# Patient Record
Sex: Female | Born: 1975 | Race: Black or African American | Hispanic: No | Marital: Single | Smoking: Former smoker
Health system: Southern US, Community
[De-identification: ages and names within clinical notes are randomized; demographics above are authoritative.]

## PROBLEM LIST (undated history)

## (undated) DIAGNOSIS — I10 Essential (primary) hypertension: Secondary | ICD-10-CM

## (undated) HISTORY — PX: FEMUR FRACTURE SURGERY: SHX633

## (undated) HISTORY — PX: KIDNEY STONE SURGERY: SHX686

---

## 1998-09-24 ENCOUNTER — Inpatient Hospital Stay (HOSPITAL_COMMUNITY): Admission: EM | Admit: 1998-09-24 | Discharge: 1998-09-28 | Payer: Self-pay | Admitting: Emergency Medicine

## 1998-09-26 ENCOUNTER — Encounter: Payer: Self-pay | Admitting: Internal Medicine

## 1999-01-15 ENCOUNTER — Other Ambulatory Visit: Admission: RE | Admit: 1999-01-15 | Discharge: 1999-01-15 | Payer: Self-pay | Admitting: Family Medicine

## 2002-01-15 ENCOUNTER — Emergency Department (HOSPITAL_COMMUNITY): Admission: EM | Admit: 2002-01-15 | Discharge: 2002-01-15 | Payer: Self-pay | Admitting: Emergency Medicine

## 2002-03-04 ENCOUNTER — Encounter: Admission: RE | Admit: 2002-03-04 | Discharge: 2002-03-04 | Payer: Self-pay | Admitting: Internal Medicine

## 2002-04-04 ENCOUNTER — Encounter: Admission: RE | Admit: 2002-04-04 | Discharge: 2002-04-04 | Payer: Self-pay | Admitting: Urology

## 2002-04-04 ENCOUNTER — Encounter: Payer: Self-pay | Admitting: Urology

## 2002-07-17 ENCOUNTER — Encounter: Payer: Self-pay | Admitting: Emergency Medicine

## 2002-07-17 ENCOUNTER — Emergency Department (HOSPITAL_COMMUNITY): Admission: EM | Admit: 2002-07-17 | Discharge: 2002-07-18 | Payer: Self-pay | Admitting: Emergency Medicine

## 2002-08-31 ENCOUNTER — Emergency Department (HOSPITAL_COMMUNITY): Admission: EM | Admit: 2002-08-31 | Discharge: 2002-08-31 | Payer: Self-pay | Admitting: *Deleted

## 2002-09-26 ENCOUNTER — Emergency Department (HOSPITAL_COMMUNITY): Admission: EM | Admit: 2002-09-26 | Discharge: 2002-09-26 | Payer: Self-pay | Admitting: Emergency Medicine

## 2003-01-04 ENCOUNTER — Emergency Department (HOSPITAL_COMMUNITY): Admission: EM | Admit: 2003-01-04 | Discharge: 2003-01-05 | Payer: Self-pay

## 2003-01-05 ENCOUNTER — Encounter: Payer: Self-pay | Admitting: Emergency Medicine

## 2003-08-01 ENCOUNTER — Ambulatory Visit (HOSPITAL_COMMUNITY): Admission: RE | Admit: 2003-08-01 | Discharge: 2003-08-01 | Payer: Self-pay | Admitting: Obstetrics

## 2003-12-05 ENCOUNTER — Inpatient Hospital Stay (HOSPITAL_COMMUNITY): Admission: RE | Admit: 2003-12-05 | Discharge: 2003-12-08 | Payer: Self-pay | Admitting: Obstetrics

## 2003-12-05 ENCOUNTER — Encounter (INDEPENDENT_AMBULATORY_CARE_PROVIDER_SITE_OTHER): Payer: Self-pay | Admitting: Specialist

## 2004-04-05 IMAGING — US US OB DETAIL+14 WK
1 series · 13 of 28 positions shown · non-contrast
Comparison: none

CLINICAL DATA: G3 P1 TAB1.  LMP the end [DATE].  EDC 12/07/03 by office ultrasound.  Assess fetal anatomy.

[Series 1: unknown · 0.35mm/px · 13 of 90 slices shown]
[im 4/90]
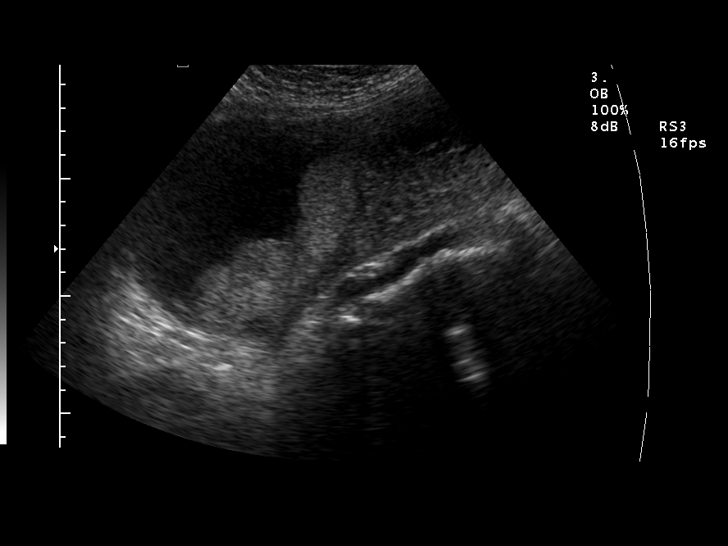
[im 10/90]
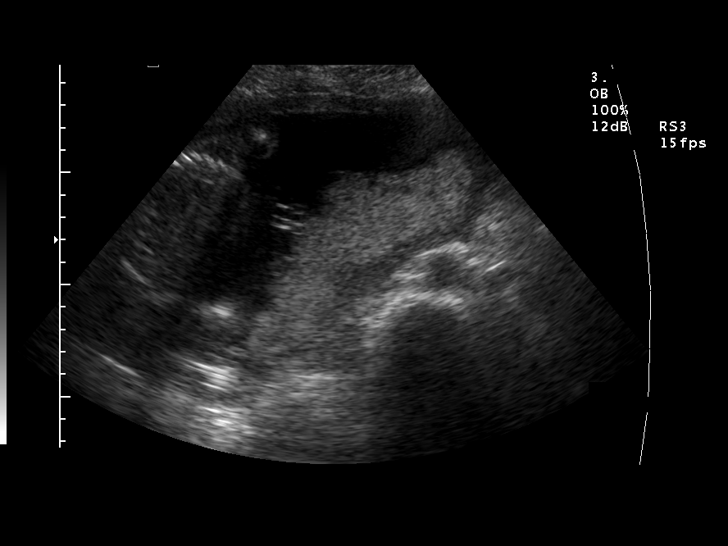
[im 17/90]
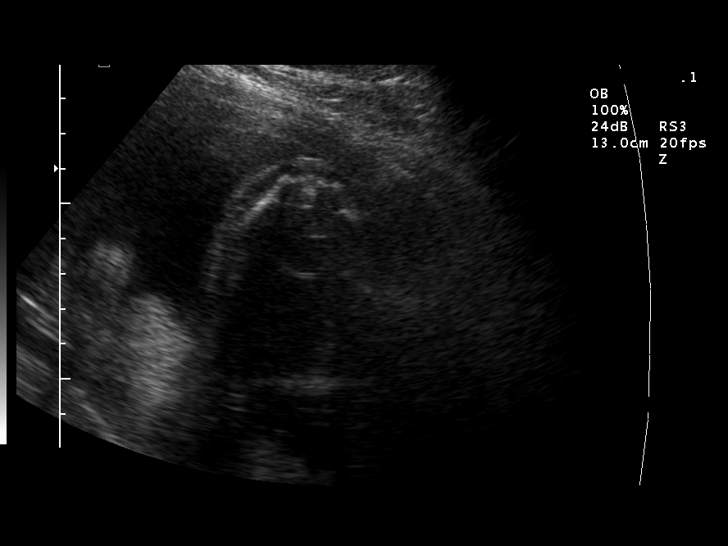
[im 24/90]
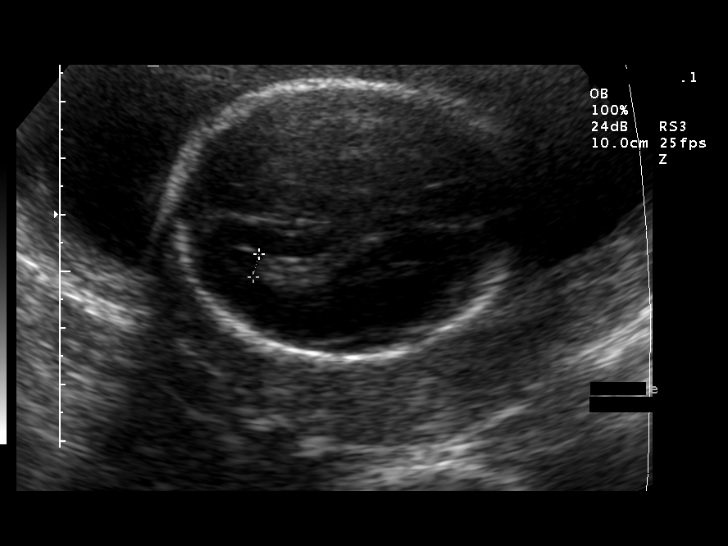
[im 30/90]
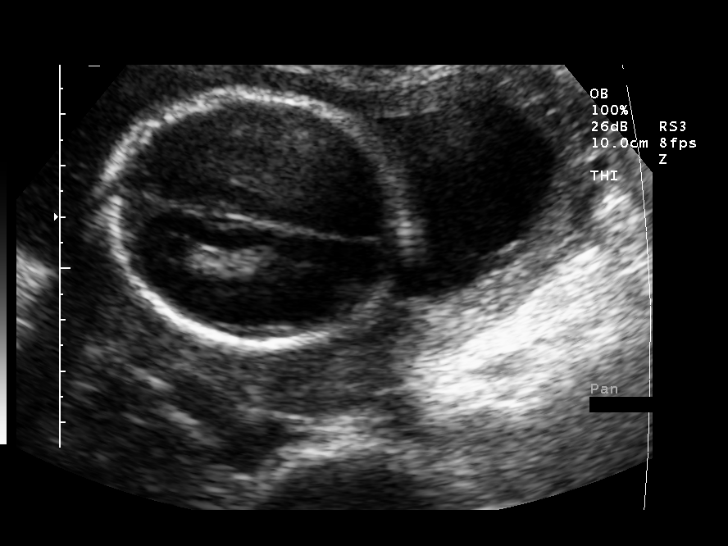
[im 37/90]
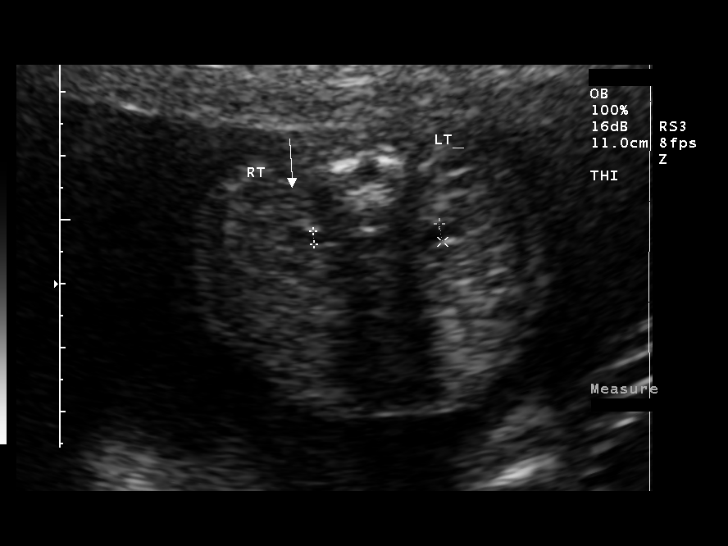
[im 47/90]
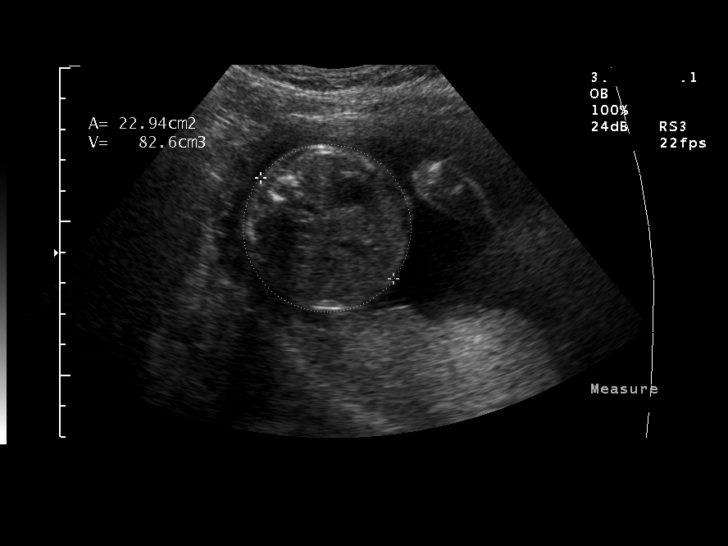
[im 53/90]
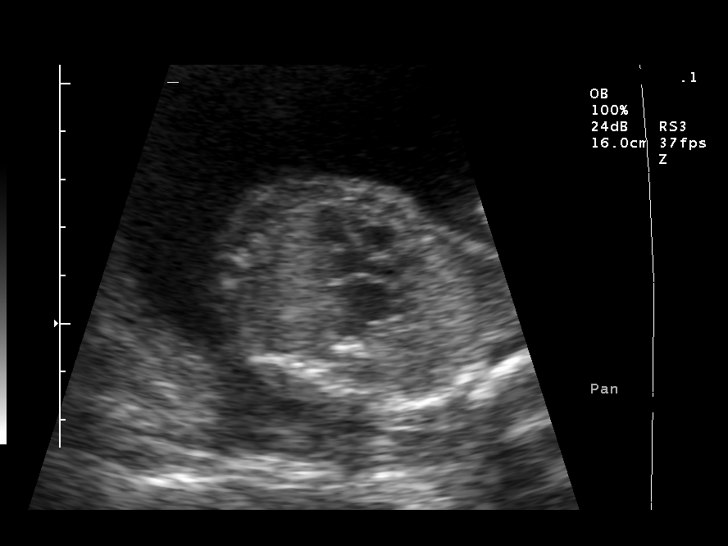
[im 60/90]
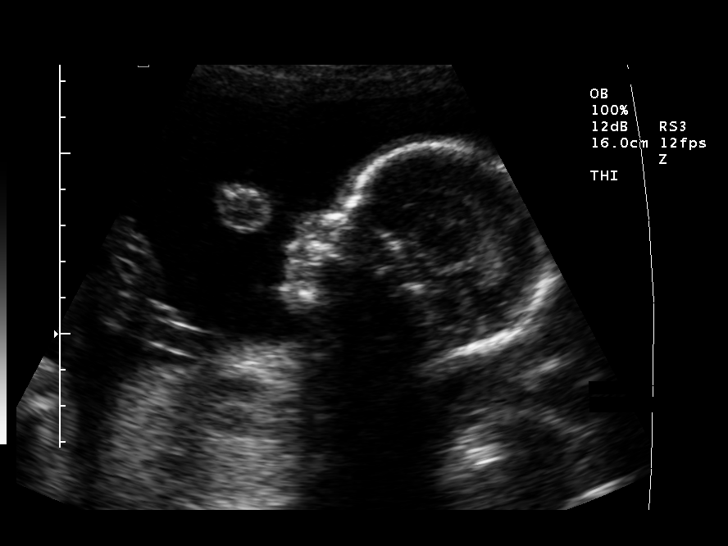
[im 66/90]
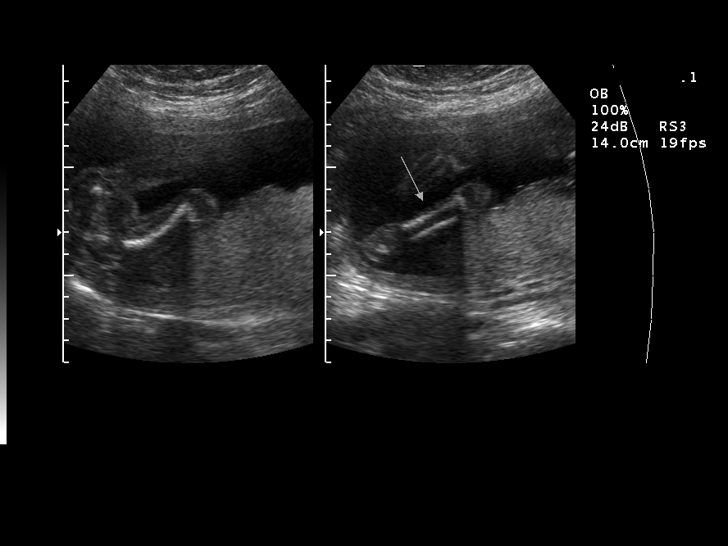
[im 73/90]
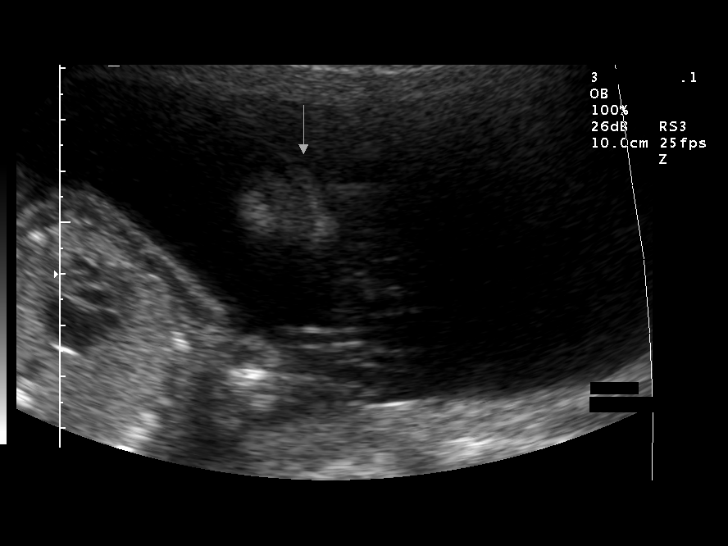
[im 80/90]
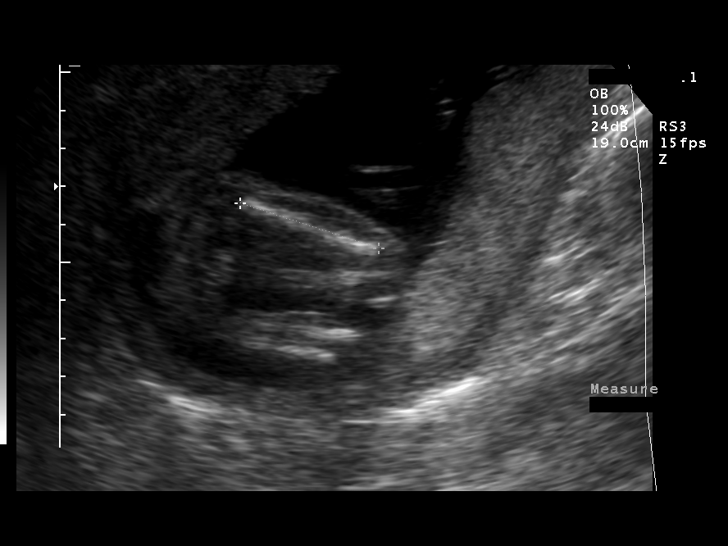
[im 86/90]
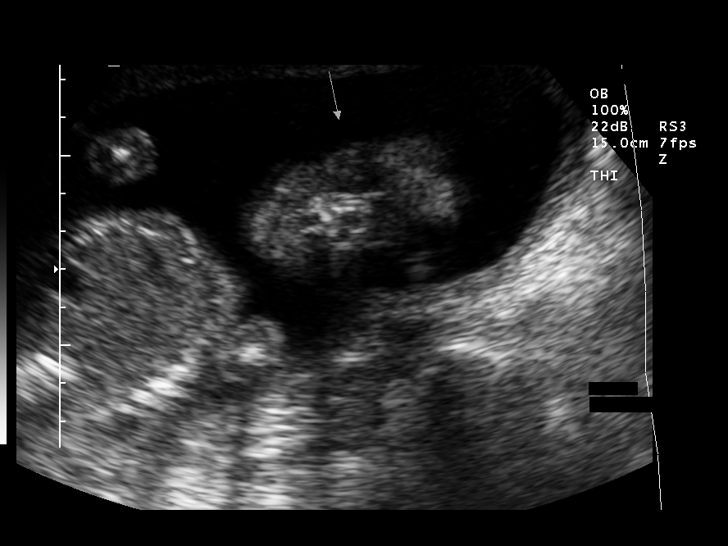

[13 of 28 positions shown; findings below may reference images not displayed]

OBSTETRICAL ULTRASOUND DETAILED

NUMBER OF FETUSES:  1
HEART RATE:  140
MOVEMENT:  Yes
BREATHING:  No
PRESENTATION:  Cephalic 
PLACENTAL LOCATION:  Posterior
GRADE:  I
PREVIA:  No
AMNIOTIC FUID (SUBJECTIVE):  Normal
AMNIOTIC FLUID (OBJECTIVE):  5.9 cm Vertical pocket 

FETAL BIOMETRY
BPD:  5.1 cm   21 w 3 d
HC:  19.2 cm  21 w 3 d
AC:  16.8 cm  21 w 6 d
FL:  4.0 cm  22 w 3 d
HL:  3.5 cm   22 w 1 d
MEAN GA:  21 w 6 d
ASSIGNED GA  21 w 5 d

FETAL ANATOMY
LATERAL VENTRICLES:  Visualized 
THALAMI/CPS:  Visualized 
POSTERIOR FOSSA:  Visualized 
NUCHAL REGION:  N/A
SPINE:  Visualized 
4 CHAMBER HEART ON LEFT:  Visualized 
STOMACH ON LEFT:  Visualized 
3 VESSEL CORD:  Visualized 
CORD INSERTION SITE:  Visualized 
KIDNEYS:  Visualized 
BLADDER:  Visualized 
EXTREMITIES:  Visualized 

ADDITIONAL ANATOMY VISUALIZED:    LVOT, RVOT, upper lip, orbits, profile, diaphragm, heel, 5th digit, ductal arch, aortic arch, nasal bone
Comment:  There is an echogenic intracardiac focus in the left ventricle.  This finding can be seen in 5% of normal fetuses.  There is a slight association of the this finding with Down syndrome.  In a 27-year-old patient, the preultrasound odds of Down syndrome would be [DATE].  With the EIF as an isolated finding, this would change that risk to [DATE].  One may wish to correlate with the results of maternal serum screening for the risk of Down syndrome.

MATERNAL FINDINGS
CERVIX:  5.8 cm Transabdominally
IMPRESSION: Single living intrauterine fetus in cephalic presentation.  Patient is 21 weeks 5 days by first office ultrasound and measures 21 weeks 6 days today indicating appropriate growth.  
Echogenic intracardiac focus in the left ventricle.  See above discussion of this finding.  No other anatomic abnormality is identified.

## 2007-04-21 ENCOUNTER — Emergency Department (HOSPITAL_COMMUNITY): Admission: EM | Admit: 2007-04-21 | Discharge: 2007-04-21 | Payer: Self-pay | Admitting: *Deleted

## 2007-12-25 IMAGING — CR DG CHEST 2V
2 series · 2 of 2 positions shown · non-contrast
Comparison: None available.

CLINICAL DATA: Chest pain.
 CHEST - 2 VIEW:

[w chest pa]
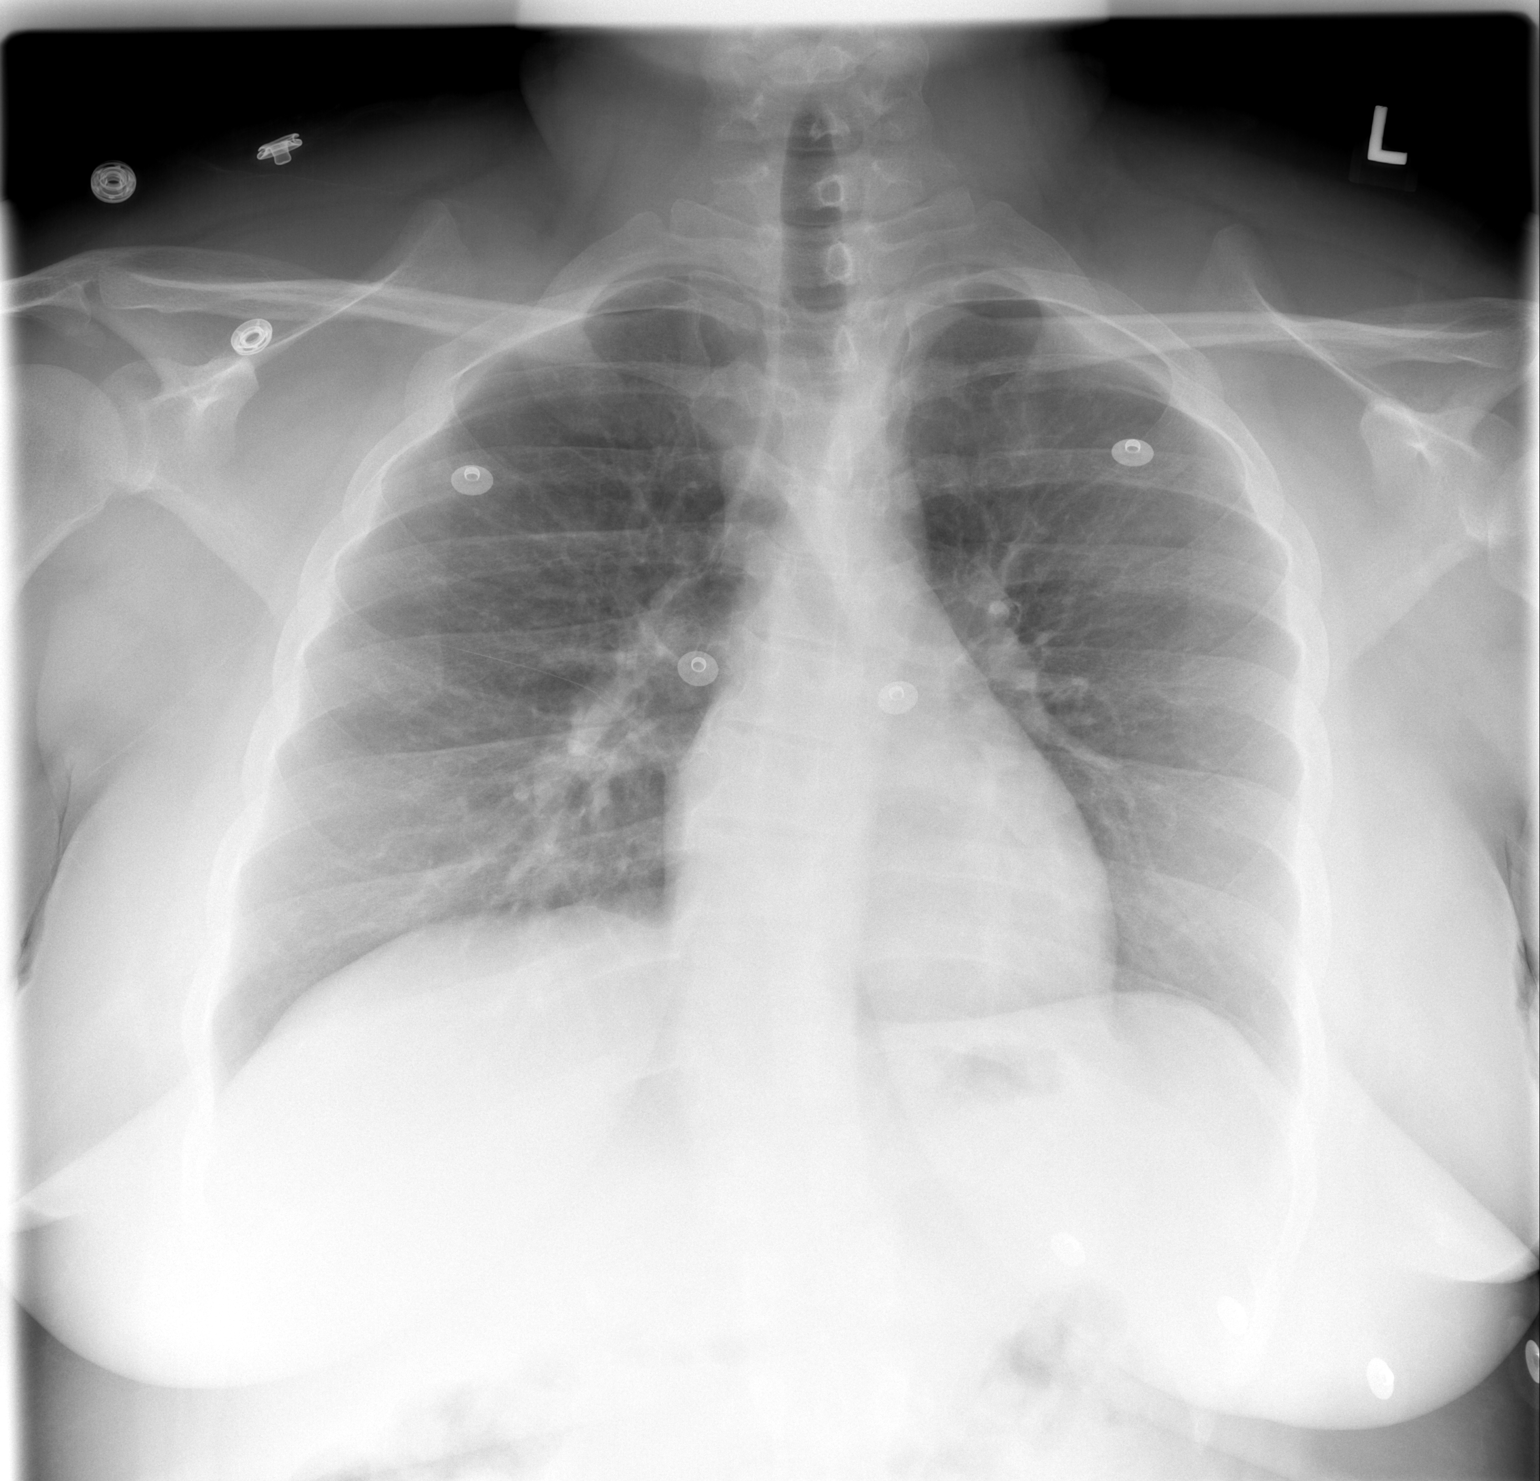

[w chest lat]
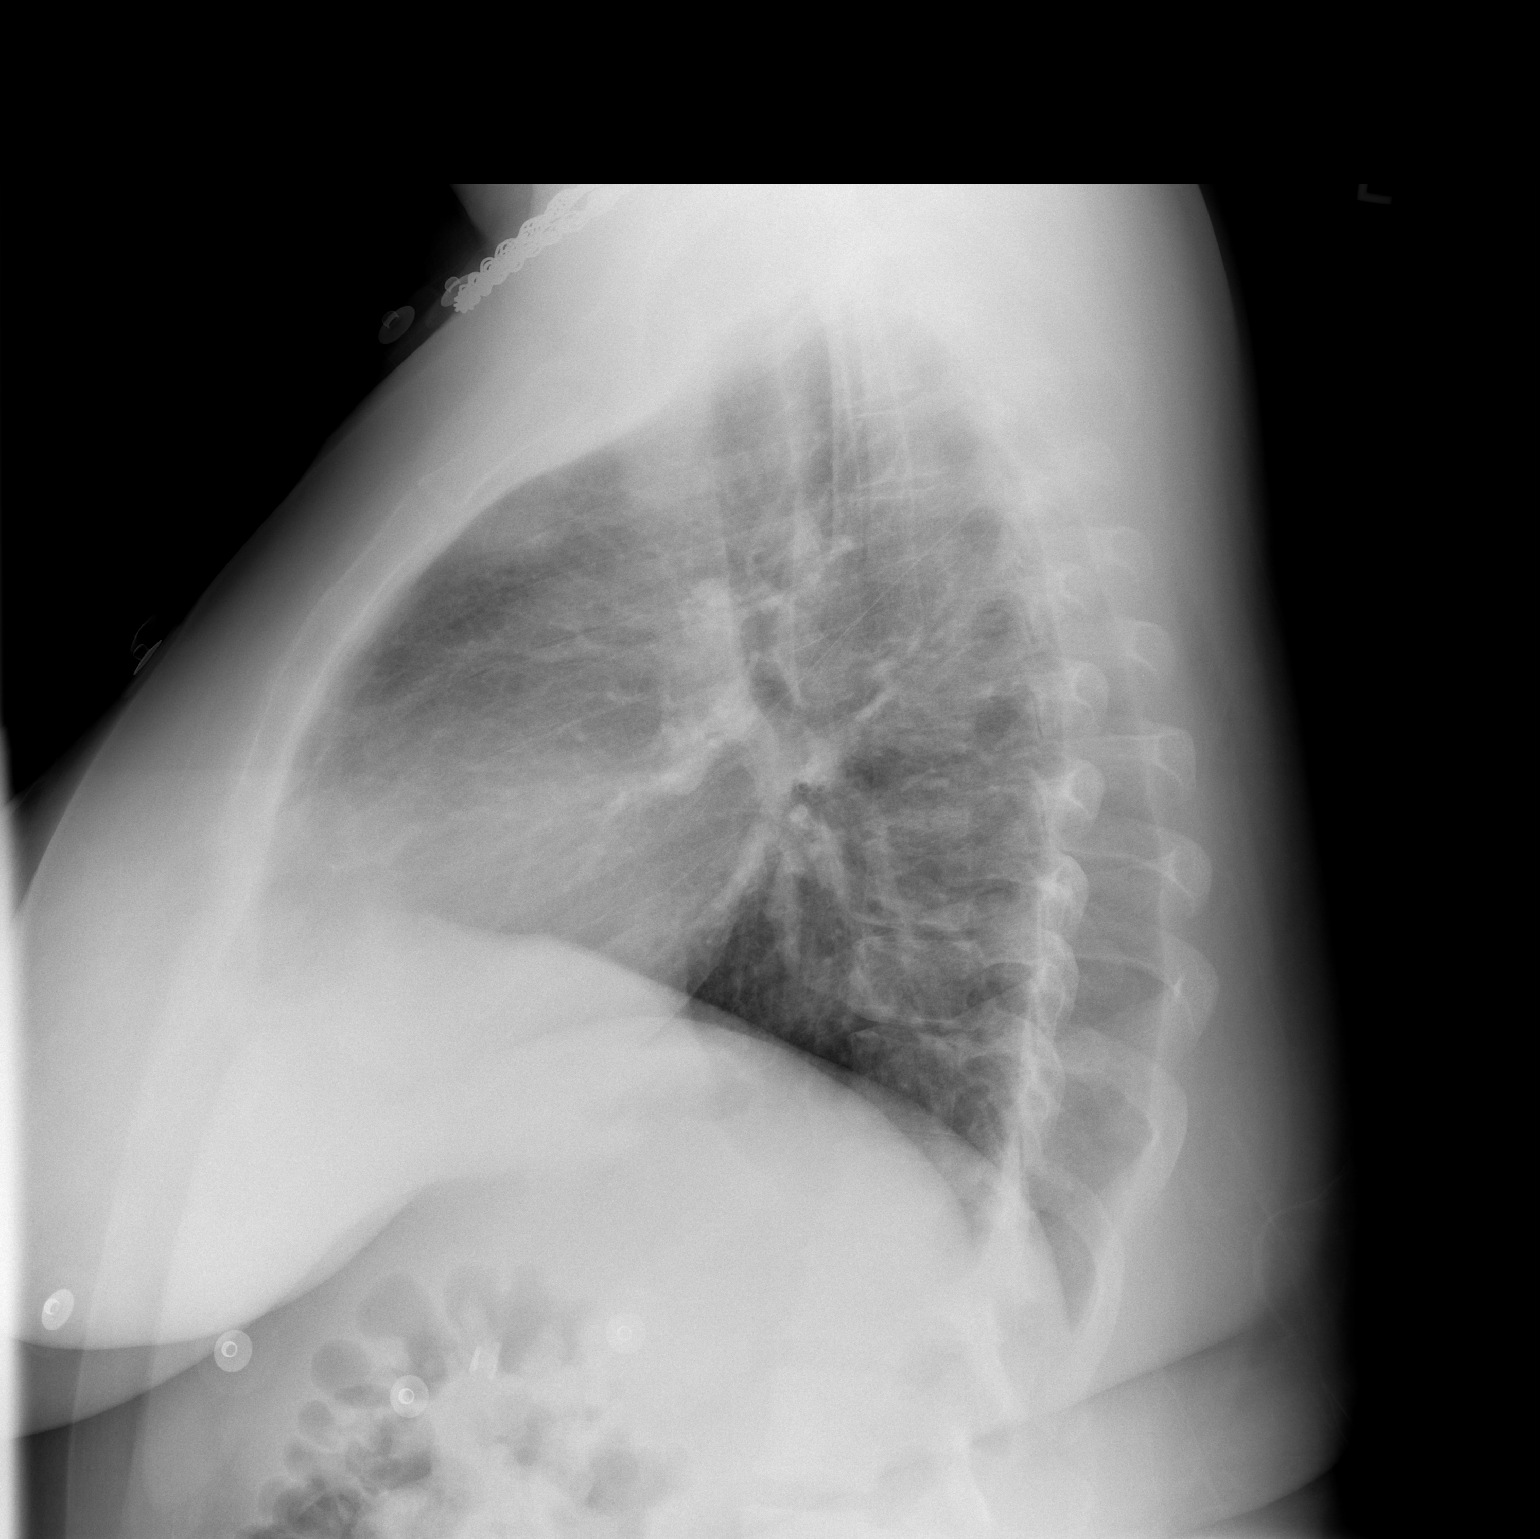

[2 of 2 positions shown; findings below may reference images not displayed]

FINDINGS: The cardiomediastinal silhouette is unremarkable.  Mild peribronchial thickening is noted without focal airspace disease, pleural effusions or pneumothorax.  Visualized bony thorax is unremarkable.
IMPRESSION: Mild peribronchial thickening without focal airspace disease.

## 2009-03-19 ENCOUNTER — Ambulatory Visit (HOSPITAL_COMMUNITY): Payer: Self-pay | Admitting: Marriage and Family Therapist

## 2011-01-03 NOTE — Discharge Summary (Signed)
Anita Jordan, Anita Jordan                          ACCOUNT NO.:  [id number]   MEDICAL RECORD NO.:  [id number]                   PATIENT TYPE:  INP   LOCATION:  9104                                 FACILITY:  [facility]   PHYSICIAN:  Kathreen Cosier, M.D.           DATE OF BIRTH:  [birth year]   DATE OF ADMISSION:  12/05/2003  DATE OF DISCHARGE:  12/08/2003                                 DISCHARGE SUMMARY   HOSPITAL COURSE:  The patient is a 35 year old female who had a previous C-  section and she was at term and in for repeat C-section at term.  She had  her repeat low transverse cesarean section, had a female, Apgars 9 and 9,  weighing 7 pounds 3 ounces.  Fluid was clear.  The team was in attendance.  The patient did well and was discharged on postoperative day #3 ambulatory  and on a regular diet, hemoglobin 10.8.  She received Depo-Provera 150 mg IM  and was discharged on Tylox.   DISCHARGE DIAGNOSIS:  Status post repeat low transverse cesarean section at  term.                                               Kathreen Cosier, M.D.    BAM/MEDQ  D:  12/08/2003  T:  12/08/2003  Job:  [id number]

## 2011-01-03 NOTE — Op Note (Signed)
[name], Anita Jordan                          ACCOUNT NO.:  [id number]   MEDICAL RECORD NO.:  [id number]                   PATIENT TYPE:  INP   LOCATION:  9104                                 FACILITY:  [facility]   PHYSICIAN:  Kathreen Cosier, M.D.           DATE OF BIRTH:  1976-07-15   DATE OF PROCEDURE:  12/05/2003  DATE OF DISCHARGE:                                 OPERATIVE REPORT   PREOPERATIVE DIAGNOSIS:  Previous cesarean section at term, desires repeat.   SURGEON:  Kathreen Cosier, M.D.   FIRST ASSISTANT:  Charles A. Clearance Coots, M.D.   ANESTHESIA:  Spinal.   PROCEDURE:  Patient placed on the operating table in supine position after  the spinal had been administered.  Abdomen prepped and draped, bladder  emptied with a Foley catheter.  A transverse suprapubic incision made  through the old scar, carried down to the rectus fascia.  Fascia cleaned and  incised the length of the incision and the recti muscles retracted  laterally.  Peritoneum incised longitudinally.  A transverse incision made  in the visceral peritoneum above the bladder and the bladder mobilized  inferiorly.  A transverse lower uterine incision made and the fluid was  clear.  We delivered a female, Apgar 9 and 9, weighing 7 pounds 3 ounces,  from the LOA position.  The team was in attendance.  The placenta was  posterior and removed manually, and the uterine cavity cleaned with dry  laps.  The uterine incision closed in one layer with continuous suture of #1  chromic.  Hemostasis was satisfactory.  Bladder flap reattached with 2-0  chromic.  Tubes and ovaries normal.  Abdomen closed in layers, the  peritoneum with continuous suture of 0 chromic, the fascia with a continuous  suture of 0 Dexon, and the skin closed with subcuticular stitch of 4-0  Monocryl.  Blood loss 450 mL.  The patient tolerated the procedure well,  taken to the recovery room in good condition.       Kathreen Cosier, M.D.    BAM/MEDQ  D:  12/05/2003  T:  12/05/2003  Job:  [id number]

## 2011-05-30 LAB — I-STAT 8, (EC8 V) (CONVERTED LAB)
Bicarbonate: 25.2 — ABNORMAL HIGH
Hemoglobin: 12.6
Operator id: 277751
Sodium: 140
TCO2: 26

## 2011-05-30 LAB — POCT CARDIAC MARKERS
CKMB, poc: <1 — ABNORMAL LOW
Myoglobin, poc: 47

## 2011-05-30 LAB — DIFFERENTIAL
Basophils Absolute: 0
Basophils Relative: 0
Neutro Abs: 7.2
Neutrophils Relative %: 59

## 2011-05-30 LAB — CBC
MCHC: 33.4
RBC: 4.16
RDW: 15.3 — ABNORMAL HIGH

## 2013-03-10 ENCOUNTER — Other Ambulatory Visit (HOSPITAL_COMMUNITY): Payer: Self-pay | Admitting: Obstetrics

## 2013-03-10 DIAGNOSIS — Z1231 Encounter for screening mammogram for malignant neoplasm of breast: Secondary | ICD-10-CM

## 2013-03-17 ENCOUNTER — Ambulatory Visit (HOSPITAL_COMMUNITY): Payer: Self-pay

## 2013-03-22 ENCOUNTER — Ambulatory Visit (HOSPITAL_COMMUNITY)
Admission: RE | Admit: 2013-03-22 | Discharge: 2013-03-22 | Disposition: A | Discharge status: Home / Self Care | Payer: 59 | Source: Ambulatory Visit | Attending: Obstetrics | Admitting: Obstetrics

## 2013-03-22 DIAGNOSIS — Z1231 Encounter for screening mammogram for malignant neoplasm of breast: Secondary | ICD-10-CM

## 2013-12-18 ENCOUNTER — Encounter (HOSPITAL_COMMUNITY): Payer: Self-pay | Admitting: Emergency Medicine

## 2013-12-18 ENCOUNTER — Emergency Department (HOSPITAL_COMMUNITY)
Admission: EM | Admit: 2013-12-18 | Discharge: 2013-12-18 | Disposition: A | Discharge status: Home / Self Care | Payer: 59 | Attending: Emergency Medicine | Admitting: Emergency Medicine

## 2013-12-18 DIAGNOSIS — Z79899 Other long term (current) drug therapy: Secondary | ICD-10-CM | POA: Insufficient documentation

## 2013-12-18 DIAGNOSIS — R519 Headache, unspecified: Secondary | ICD-10-CM

## 2013-12-18 DIAGNOSIS — R51 Headache: Secondary | ICD-10-CM

## 2013-12-18 DIAGNOSIS — F17200 Nicotine dependence, unspecified, uncomplicated: Secondary | ICD-10-CM | POA: Insufficient documentation

## 2013-12-18 DIAGNOSIS — J029 Acute pharyngitis, unspecified: Secondary | ICD-10-CM

## 2013-12-18 DIAGNOSIS — I10 Essential (primary) hypertension: Secondary | ICD-10-CM | POA: Insufficient documentation

## 2013-12-18 HISTORY — DX: Essential (primary) hypertension: I10

## 2013-12-18 LAB — RAPID STREP SCREEN (MED CTR MEBANE ONLY): STREPTOCOCCUS, GROUP A SCREEN (DIRECT): NEGATIVE

## 2013-12-18 MED ORDER — BUTALBITAL-APAP-CAFFEINE 50-325-40 MG PO TABS: 1.0000 | ORAL_TABLET | Freq: Three times a day (TID) | ORAL | Status: AC | PRN

## 2013-12-18 MED ORDER — KETOROLAC TROMETHAMINE 60 MG/2ML IM SOLN
60.0000 mg | Freq: Once | INTRAMUSCULAR | Status: AC
  Administered 2013-12-18: 60 mg via INTRAMUSCULAR
  Filled 2013-12-18: qty 2

## 2013-12-18 NOTE — ED Provider Notes (Signed)
CSN: [id number]     Arrival date & time 12/18/13  0454 History   First MD Initiated Contact with Patient 12/18/13 [phone]     Chief Complaint  Patient presents with  . Headache     (Consider location/radiation/quality/duration/timing/severity/associated sxs/prior Treatment) HPI Comments: Patient presents with complaint of headache and sore throat for the past 4 days. Patient has not had other symptoms including other URI symptoms, chest pain, cough, fever, abdominal pain, nausea, vomiting, or diarrhea. No vision change, weakness in her arms or her legs. Patient denies head injury. Headache is located in the back of her head. She's taken Tylenol with temporary relief. Course of HA is waxing and waning. She denies head injury. She does get HA in past, feels the same as this, but usually they do not last as long. Patient states that it hurts to swallow but she is not having any difficulty swallowing food or liquids. She does not have diabetes. She denies swelling in her neck. Onset of symptoms gradual.  The history is provided by the patient.    Past Medical History  Diagnosis Date  . Hypertension    History reviewed. No pertinent past surgical history. No family history on file. History  Substance Use Topics  . Smoking status: Current Every Day Smoker  . Smokeless tobacco: Not on file  . Alcohol Use: Yes   OB History   Grav Para Term Preterm Abortions TAB SAB Ect Mult Living                 Review of Systems  Constitutional: Negative for fever, chills and fatigue.  HENT: Positive for sore throat. Negative for congestion, dental problem, ear pain, rhinorrhea and sinus pressure.   Eyes: Negative for photophobia, discharge, redness and visual disturbance.  Respiratory: Negative for cough, shortness of breath and wheezing.   Cardiovascular: Negative for chest pain.  Gastrointestinal: Negative for nausea, vomiting, abdominal pain and diarrhea.  Genitourinary: Negative for dysuria.   Musculoskeletal: Negative for gait problem, myalgias, neck pain and neck stiffness.  Skin: Negative for rash.  Neurological: Positive for headaches. Negative for syncope, speech difficulty, weakness, light-headedness and numbness.  Hematological: Negative for adenopathy.  Psychiatric/Behavioral: Negative for confusion.      Allergies  Review of patient's allergies indicates no known allergies.  Home Medications   Prior to Admission medications   Medication Sig Start Date End Date Taking? Authorizing Provider  lisinopril-hydrochlorothiazide (PRINZIDE,ZESTORETIC) 20-12.5 MG per tablet Take 1 tablet by mouth daily.   Yes Historical Provider, MD  Norethindrone Acetate-Ethinyl Estrad-FE (LOMEDIA 24 FE) 1-20 MG-MCG(24) tablet Take 1 tablet by mouth daily.   Yes Historical Provider, MD  varenicline (CHANTIX) 1 MG tablet Take 1 mg by mouth daily.   Yes Historical Provider, MD   BP 152/97  Pulse 86  Temp(Src) 98.6 F (37 C)  Resp 20  Ht 5\' 4"  (1.626 m)  Wt 275 lb (124.739 kg)  BMI 47.18 kg/m2  SpO2 99%  LMP 11/18/2013 Physical Exam  Nursing note and vitals reviewed. Constitutional: She is oriented to person, place, and time. She appears well-developed and well-nourished.  HENT:  Head: Normocephalic and atraumatic.  Right Ear: Tympanic membrane, external ear and ear canal normal.  Left Ear: Tympanic membrane, external ear and ear canal normal.  Nose: Nose normal. No mucosal edema or rhinorrhea.  Mouth/Throat: Uvula is midline, oropharynx is clear and moist and mucous membranes are normal. Mucous membranes are not dry. No oral lesions. No trismus in the jaw. No uvula  swelling. No oropharyngeal exudate, posterior oropharyngeal edema, posterior oropharyngeal erythema or tonsillar abscesses.  Eyes: Conjunctivae, EOM and lids are normal. Pupils are equal, round, and reactive to light. Right eye exhibits no discharge. Left eye exhibits no discharge. Right eye exhibits no nystagmus. Left eye  exhibits no nystagmus.  Neck: Normal range of motion. Neck supple.  Cardiovascular: Normal rate, regular rhythm and normal heart sounds.   No murmur heard. No murmur.  Pulmonary/Chest: Effort normal and breath sounds normal. No respiratory distress. She has no wheezes. She has no rales.  Abdominal: Soft. There is no tenderness.  Musculoskeletal:       Cervical back: She exhibits normal range of motion, no tenderness and no bony tenderness.  Lymphadenopathy:    She has no cervical adenopathy.  Neurological: She is alert and oriented to person, place, and time. She has normal strength and normal reflexes. No cranial nerve deficit or sensory deficit. She displays a negative Romberg sign. Coordination and gait normal. GCS eye subscore is 4. GCS verbal subscore is 5. GCS motor subscore is 6.  Skin: Skin is warm and dry.  No splinter hemorrhages.   Psychiatric: She has a normal mood and affect.    ED Course  Procedures (including critical care time) Labs Review Labs Reviewed  RAPID STREP SCREEN  CULTURE, GROUP A STREP  URINALYSIS, ROUTINE W REFLEX MICROSCOPIC    Imaging Review No results found.   EKG Interpretation None      6:42 AM Patient seen and examined. Strep neg.   Vital signs reviewed and are as follows: Filed Vitals:   12/18/13 0501  BP: 152/97  Pulse: 86  Temp: 98.6 F (37 C)  Resp: 20   Plan: IM toradol for HA, fioricet for home.   Patient counseled on supportive care for viral URI and s/s to return including worsening symptoms, persistent fever, persistent vomiting, or if they have any other concerns. Urged to see PCP if symptoms persist for more than 3 days. Patient verbalizes understanding and agrees with plan.   Patient counseled to return if they have weakness in their arms or legs, slurred speech, trouble walking or talking, confusion, trouble with their balance, or if they have any other concerns. Patient verbalizes understanding and agrees with plan.      MDM   Final diagnoses:  Headache  Sore throat   HA: Patient without high-risk features of headache including: sudden onset/thunderclap HA, no similar headache in past, altered mental status, accompanying seizure, headache with exertion, age > 61, history of immunocompromised, neck or shoulder pain, fever, use of anticoagulation, family history of spontaneous SAH, concomitant drug use, toxic exposure. She does have h/o estrogen use -- but without neuro findings or abnormal findings -- no indications to image for venous sinus thrombosis. No vision change to suggest pseudotumor.   Patient has a normal complete neurological exam, normal vital signs, normal level of consciousness, no signs of meningismus, is well-appearing/non-toxic appearing, no signs of trauma, no pain over the temporal arteries.   Imaging with CT/MRI not indicated given history and physical exam findings.   No dangerous or life-threatening conditions suspected or identified by history, physical exam, and by work-up. No indications for hospitalization identified.      Carlisle Cater, [place] 12/18/13 [phone]

## 2013-12-18 NOTE — Discharge Instructions (Signed)
Please read and follow all provided instructions.  Your diagnoses today include:  1. Headache   2. Sore throat     Tests performed today include:  Strep test - no strep throat  Vital signs. See below for your results today.   Medications:  In the Emergency Department you received:  Toradol - NSAID medication similar to ibuprofen  Take any prescribed medications only as directed.  Additional information:  Follow any educational materials contained in this packet.  You are having a headache. No specific cause was found today for your headache. It may have been a migraine or other cause of headache. Stress, anxiety, fatigue, and depression are common triggers for headaches.   Your headache today does not appear to be life-threatening or require hospitalization, but often the exact cause of headaches is not determined in the emergency department. Therefore, follow-up with your doctor is very important to find out what may have caused your headache and whether or not you need any further diagnostic testing or treatment.   Sometimes headaches can appear benign (not harmful), but then more serious symptoms can develop which should prompt an immediate re-evaluation by your doctor or the emergency department.  BE VERY CAREFUL not to take multiple medicines containing Tylenol (also called acetaminophen). Doing so can lead to an overdose which can damage your liver and cause liver failure and possibly death.   Follow-up instructions: Please follow-up with your primary care provider in the next 3 days for further evaluation of your symptoms. If you do not have a primary care doctor -- see below for referral information.   Return instructions:   Please return to the Emergency Department if you experience worsening symptoms.  Return if the medications do not resolve your headache, if it recurs, or if you have multiple episodes of vomiting or cannot keep down fluids.  Return if you have a  change from the usual headache.  RETURN IMMEDIATELY IF you:  Develop a sudden, severe headache  Develop confusion or become poorly responsive or faint  Develop a fever above 100.99F or problem breathing  Have a change in speech, vision, swallowing, or understanding  Develop new weakness, numbness, tingling, incoordination in your arms or legs  Have a seizure  Please return if you have any other emergent concerns.  Additional Information:  Your vital signs today were: BP 152/97   Pulse 86   Temp(Src) 98.6 F (37 C)   Resp 20   Ht 5\' 4"  (1.626 m)   Wt 275 lb (124.739 kg)   BMI 47.18 kg/m2   SpO2 99%   LMP 11/18/2013 If your blood pressure (BP) was elevated above 135/85 this visit, please have this repeated by your doctor within one month. --------------

## 2013-12-18 NOTE — ED Notes (Signed)
The pt has had a cold with a headache and sore throat since Wednesday.  No temp

## 2013-12-18 NOTE — ED Provider Notes (Signed)
Medical screening examination/treatment/procedure(s) were performed by non-physician practitioner and as supervising physician I was immediately available for consultation/collaboration.   EKG Interpretation None        Elyn Peers, MD 12/18/13 [phone]

## 2013-12-20 LAB — CULTURE, GROUP A STREP

## 2014-03-02 ENCOUNTER — Ambulatory Visit (HOSPITAL_BASED_OUTPATIENT_CLINIC_OR_DEPARTMENT_OTHER): Payer: 59 | Attending: Internal Medicine | Admitting: Radiology

## 2014-03-02 VITALS — Ht 64.0 in | Wt 267.0 lb

## 2014-03-02 DIAGNOSIS — G4733 Obstructive sleep apnea (adult) (pediatric): Secondary | ICD-10-CM | POA: Insufficient documentation

## 2014-03-02 DIAGNOSIS — I491 Atrial premature depolarization: Secondary | ICD-10-CM | POA: Insufficient documentation

## 2014-03-02 DIAGNOSIS — G4730 Sleep apnea, unspecified: Secondary | ICD-10-CM

## 2014-03-02 DIAGNOSIS — R0989 Other specified symptoms and signs involving the circulatory and respiratory systems: Secondary | ICD-10-CM | POA: Insufficient documentation

## 2014-03-02 DIAGNOSIS — R0609 Other forms of dyspnea: Secondary | ICD-10-CM | POA: Insufficient documentation

## 2014-03-04 DIAGNOSIS — G4730 Sleep apnea, unspecified: Secondary | ICD-10-CM

## 2014-03-04 NOTE — Sleep Study (Signed)
   NAME: Anita Jordan DATE OF BIRTH:  1975/12/13 MEDICAL RECORD NUMBER [id number]  LOCATION: [facility]  PHYSICIAN: Abeer Iversen D  DATE OF STUDY: 03/02/2014  SLEEP STUDY TYPE: Nocturnal Polysomnogram               REFERRING PHYSICIAN: Philis Fendt, MD  INDICATION FOR STUDY: Hypersomnia with sleep apnea  EPWORTH SLEEPINESS SCORE:   13/24 HEIGHT: 5\' 4"  (162.6 cm)  WEIGHT: 267 lb (121.11 kg)    Body mass index is 45.81 kg/(m^2).  NECK SIZE: 15 in.  MEDICATIONS: Charted for review  SLEEP ARCHITECTURE: Total sleep time 327 minutes the sleep efficiency 87.9%. Stage I was 8.7%, stage II 67.4%, stage III absent, REM 23.9% of total sleep time. Sleep latency 35.5 minutes, REM latency 72 minutes, awake after sleep onset 9 minutes, arousal index 20.2, bedtime medication: None  RESPIRATORY DATA: Apnea hypopneas index (AHI) 9.5 per hour. 52 total events scored including 8 obstructive apneas and 44 hypopneas. Most events were while nonsupine. REM AHI 33.8 per hour. There were not enough early events to meet protocol requirements for split CPAP titration.  OXYGEN DATA: Moderate snoring with oxygen desaturation to a nadir of 81% and mean saturation of 95.9% on room air.  CARDIAC DATA: Sinus rhythm with rare PAC  MOVEMENT/PARASOMNIA: No significant movement disturbance, bathroom x1  IMPRESSION/ RECOMMENDATION:   1) Mild obstructive sleep apnea/hypopneas syndrome, AHI 9.5 per hour with events in all positions, especially nonsupine. Most events were associated with REM sleep, REM AHI 33.8 per hour. Moderate snoring with oxygen desaturation to a nadir of 81% on room air. 2) There were not enough early events to permit split protocol CPAP titration. If conservative measures aren't sufficient, then based on individual considerations, CPAP or an oral appliance might be considered.  Signed Baird Lyons M.D. Deneise Lever Diplomate, American Board of Sleep  Medicine  ELECTRONICALLY SIGNED ON:  03/04/2014, 12:40 PM [place] PH: [phone]   FX: [phone] [place]

## 2014-06-02 ENCOUNTER — Other Ambulatory Visit: Payer: Self-pay

## 2016-04-12 ENCOUNTER — Encounter: Payer: Self-pay | Admitting: *Deleted

## 2016-07-15 ENCOUNTER — Ambulatory Visit (HOSPITAL_COMMUNITY)
Admission: EM | Admit: 2016-07-15 | Discharge: 2016-07-15 | Disposition: A | Discharge status: Home / Self Care | Payer: 59 | Attending: Emergency Medicine | Admitting: Emergency Medicine

## 2016-07-15 ENCOUNTER — Encounter (HOSPITAL_COMMUNITY): Payer: Self-pay | Admitting: Emergency Medicine

## 2016-07-15 DIAGNOSIS — J020 Streptococcal pharyngitis: Secondary | ICD-10-CM

## 2016-07-15 LAB — POCT RAPID STREP A: Streptococcus, Group A Screen (Direct): POSITIVE — AB

## 2016-07-15 MED ORDER — AMOXICILLIN 500 MG PO CAPS: 1000.0000 mg | ORAL_CAPSULE | Freq: Two times a day (BID) | ORAL | 0 refills | Status: DC

## 2016-07-15 NOTE — Discharge Instructions (Signed)
You have strep throat. Take amoxicillin for 10 days. Use nasal saline spray and Flonase to help with the congestion. Take Tylenol or ibuprofen as needed for body aches and headache. Follow-up as needed.

## 2016-07-15 NOTE — ED Triage Notes (Signed)
Coughing last week, but started feeling bad last night.  C/o sore throat, nasal stuffiness, headache.  Denies fever

## 2016-07-15 NOTE — ED Provider Notes (Signed)
[place]    CSN: [id number] Arrival date & time: 07/15/16  1247     History   Chief Complaint Chief Complaint  Patient presents with  . URI    HPI Anita Jordan is a 40 y.o. female.   HPI  She is a 40 year old woman here for evaluation of sore throat. She states she has had a mild cough for the last 7-10 days. Last night, she developed headaches, body aches, and sore throat. Today, she has also developed nasal congestion and drainage. She denies any nausea or vomiting. No known fevers. Her appetite is decreased, but she is able to tolerate food and liquids. She has been taking TheraFlu without improvement.  Past Medical History:  Diagnosis Date  . Hypertension     There are no active problems to display for this patient.   Past Surgical History:  Procedure Laterality Date  . CESAREAN SECTION    . FEMUR FRACTURE SURGERY    . KIDNEY STONE SURGERY      OB History    No data available       Home Medications    Prior to Admission medications   Medication Sig Start Date End Date Taking? Authorizing Provider  Phenylephrine-Pheniramine-DM [facility] COLD & COUGH PO) Take by mouth.   Yes Historical Provider, MD  amoxicillin (AMOXIL) 500 MG capsule Take 2 capsules (1,000 mg total) by mouth 2 (two) times daily. For 10 days 07/15/16   Melony Overly, MD  lisinopril-hydrochlorothiazide (PRINZIDE,ZESTORETIC) 20-12.5 MG per tablet Take 1 tablet by mouth daily.    Historical Provider, MD  Norethindrone Acetate-Ethinyl Estrad-FE (LOMEDIA 24 FE) 1-20 MG-MCG(24) tablet Take 1 tablet by mouth daily.    Historical Provider, MD  varenicline (CHANTIX) 1 MG tablet Take 1 mg by mouth daily.    Historical Provider, MD    Family History No family history on file.  Social History Social History  Substance Use Topics  . Smoking status: Current Every Day Smoker  . Smokeless tobacco: Never Used  . Alcohol use Yes     Allergies   Patient has no known  allergies.   Review of Systems Review of Systems As in history of present illness  Physical Exam Triage Vital Signs ED Triage Vitals  Enc Vitals Group     BP 07/15/16 1343 137/83     Pulse Rate 07/15/16 1343 99     Resp 07/15/16 1343 24     Temp 07/15/16 1343 99.4 F (37.4 C)     Temp Source 07/15/16 1343 Oral     SpO2 07/15/16 1343 96 %     Weight --      Height --      Head Circumference --      Peak Flow --      Pain Score 07/15/16 1342 6     Pain Loc --      Pain Edu? --      Excl. in [place]? --    No data found.   Updated Vital Signs BP 137/83 (BP Location: Left Arm) Comment (BP Location): large cuff  Pulse 99   Temp 99.4 F (37.4 C) (Oral)   Resp 24   LMP 06/24/2016   SpO2 96%   Visual Acuity Right Eye Distance:   Left Eye Distance:   Bilateral Distance:    Right Eye Near:   Left Eye Near:    Bilateral Near:     Physical Exam  Constitutional: She is oriented to person, place,  and time. She appears well-developed and well-nourished. No distress.  HENT:  TMs normal bilaterally. There is nasal discharge present. Nasal mucosa is normal. Oropharynx difficult to visualize due to tongue. However, visualize uvula and tonsils are quite erythematous and swollen. Her voice sounds slightly muffled.  Neck: Neck supple.  Cardiovascular: Normal rate, regular rhythm and normal heart sounds.   No murmur heard. Pulmonary/Chest: Effort normal and breath sounds normal. No respiratory distress. She has no wheezes. She has no rales.  Lymphadenopathy:    She has cervical adenopathy.  Neurological: She is alert and oriented to person, place, and time.     UC Treatments / Results  Labs (all labs ordered are listed, but only abnormal results are displayed) Labs Reviewed  POCT RAPID STREP A - Abnormal; Notable for the following:       Result Value   Streptococcus, Group A Screen (Direct) POSITIVE (*)    All other components within normal limits    EKG  EKG  Interpretation None       Radiology No results found.  Procedures Procedures (including critical care time)  Medications Ordered in UC Medications - No data to display   Initial Impression / Assessment and Plan / UC Course  I have reviewed the triage vital signs and the nursing notes.  Pertinent labs & imaging results that were available during my care of the patient were reviewed by me and considered in my medical decision making (see chart for details).  Clinical Course     Rapid strep is positive. Treat with amoxicillin. Discussed symptomatic treatment for nasal congestion. Follow-up as needed.  Final Clinical Impressions(s) / UC Diagnoses   Final diagnoses:  Strep pharyngitis    New Prescriptions Discharge Medication List as of 07/15/2016  2:27 PM    START taking these medications   Details  amoxicillin (AMOXIL) 500 MG capsule Take 2 capsules (1,000 mg total) by mouth 2 (two) times daily. For 10 days, Starting Tue 07/15/2016, Normal         Melony Overly, MD 07/15/16 1450

## 2017-02-24 ENCOUNTER — Ambulatory Visit (HOSPITAL_COMMUNITY)
Admission: EM | Admit: 2017-02-24 | Discharge: 2017-02-24 | Disposition: A | Discharge status: Home / Self Care | Payer: 59 | Attending: Family Medicine | Admitting: Family Medicine

## 2017-02-24 ENCOUNTER — Encounter (HOSPITAL_COMMUNITY): Payer: Self-pay | Admitting: Emergency Medicine

## 2017-02-24 DIAGNOSIS — J020 Streptococcal pharyngitis: Secondary | ICD-10-CM | POA: Diagnosis not present

## 2017-02-24 LAB — POCT RAPID STREP A: STREPTOCOCCUS, GROUP A SCREEN (DIRECT): POSITIVE — AB

## 2017-02-24 MED ORDER — DEXAMETHASONE SODIUM PHOSPHATE 10 MG/ML IJ SOLN
INTRAMUSCULAR | Status: AC
  Filled 2017-02-24: qty 1

## 2017-02-24 MED ORDER — PENICILLIN G BENZATHINE 1200000 UNIT/2ML IM SUSP
1.2000 10*6.[IU] | Freq: Once | INTRAMUSCULAR | Status: AC
  Administered 2017-02-24: 1.2 10*6.[IU] via INTRAMUSCULAR

## 2017-02-24 MED ORDER — DEXAMETHASONE SODIUM PHOSPHATE 10 MG/ML IJ SOLN
10.0000 mg | Freq: Once | INTRAMUSCULAR | Status: AC
  Administered 2017-02-24: 10 mg via INTRAMUSCULAR

## 2017-02-24 MED ORDER — MAGIC MOUTHWASH W/LIDOCAINE: 5.0000 mL | Freq: Three times a day (TID) | ORAL | 0 refills | Status: AC | PRN

## 2017-02-24 MED ORDER — PENICILLIN G BENZATHINE 1200000 UNIT/2ML IM SUSP
INTRAMUSCULAR | Status: AC
  Filled 2017-02-24: qty 2

## 2017-02-24 NOTE — Discharge Instructions (Signed)
Your strep test was positive, you have been treated with an injection of penicillin. For pain, I have prescribed magic mouthwash, swish and swallow 3 times a day as needed. If your symptoms persist or fail to resolve follow up with your primary care provider or return to clinic.

## 2017-02-24 NOTE — ED Triage Notes (Signed)
The patient presented to the [facility] with a complaint of a sore throat x 2 days. The patient denied any fever.

## 2017-02-24 NOTE — ED Provider Notes (Signed)
CSN: [id number]     Arrival date & time 02/24/17  1151 History   None    Chief Complaint  Patient presents with  . Sore Throat   (Consider location/radiation/quality/duration/timing/severity/associated sxs/prior Treatment) Anita Jordan is a 41 y.o. female with a past hx of HTN who presents to the [facility] urgent care with a chief complaint of sore throat for 24 hours.    The history is provided by the patient.  Sore Throat  The current episode started 12 to 24 hours ago. The problem occurs constantly. The problem has been gradually worsening. Pertinent negatives include no chest pain, no abdominal pain, no headaches and no shortness of breath. Associated symptoms comments: No cough. The symptoms are aggravated by swallowing and eating. Nothing relieves the symptoms. She has tried nothing for the symptoms. The treatment provided no relief.    Past Medical History:  Diagnosis Date  . Hypertension    Past Surgical History:  Procedure Laterality Date  . CESAREAN SECTION    . FEMUR FRACTURE SURGERY    . KIDNEY STONE SURGERY     History reviewed. No pertinent family history. Social History  Substance Use Topics  . Smoking status: Current Every Day Smoker  . Smokeless tobacco: Never Used  . Alcohol use Yes   OB History    No data available     Review of Systems  Constitutional: Negative.   HENT: Positive for sore throat. Negative for congestion, sinus pain, sinus pressure, trouble swallowing and voice change.   Respiratory: Negative for shortness of breath and wheezing.   Cardiovascular: Negative for chest pain and palpitations.  Gastrointestinal: Negative for abdominal pain, nausea and vomiting.  Musculoskeletal: Negative.   Skin: Negative.   Neurological: Negative for headaches.    Allergies  Patient has no known allergies.  Home Medications   Prior to Admission medications   Medication Sig Start Date End Date Taking? Authorizing Provider   lisinopril-hydrochlorothiazide (PRINZIDE,ZESTORETIC) 20-12.5 MG per tablet Take 1 tablet by mouth daily.   Yes [provider]  magic mouthwash w/lidocaine SOLN Take 5 mLs by mouth 3 (three) times daily as needed for mouth pain. 02/24/17   Barnet Glasgow, NP   Meds Ordered and Administered this Visit   Medications  dexamethasone (DECADRON) injection 10 mg (10 mg Intramuscular Given 02/24/17 1238)  penicillin g benzathine (BICILLIN LA) 1200000 UNIT/2ML injection 1.2 Million Units (1.2 Million Units Intramuscular Given 02/24/17 1254)    BP (!) 184/108 (BP Location: Right Arm)   Pulse (!) 104   Temp 100.1 F (37.8 C) (Oral)   Resp 20   SpO2 100%  No data found.   Physical Exam  Constitutional: She is oriented to person, place, and time. She appears well-developed and well-nourished. No distress.  HENT:  Head: Normocephalic and atraumatic.  Right Ear: Tympanic membrane and external ear normal.  Left Ear: Tympanic membrane and external ear normal.  Nose: Nose normal.  Mouth/Throat: Posterior oropharyngeal edema and posterior oropharyngeal erythema present. Tonsils are 3+ on the right. Tonsils are 3+ on the left. No tonsillar exudate.  Eyes: Conjunctivae are normal.  Neck: Normal range of motion.  Cardiovascular: Normal rate and regular rhythm.   Pulmonary/Chest: Effort normal and breath sounds normal.  Lymphadenopathy:    She has cervical adenopathy.  Neurological: She is alert and oriented to person, place, and time.  Skin: Skin is warm and dry. Capillary refill takes less than 2 seconds. No rash noted. She is not diaphoretic. No erythema.  Psychiatric: She has a normal mood and affect. Her behavior is normal.  Nursing note and vitals reviewed.   Urgent Care Course     Procedures (including critical care time)  Labs Review Labs Reviewed  POCT RAPID STREP A - Abnormal; Notable for the following:       Result Value   Streptococcus, Group A Screen (Direct) POSITIVE  (*)    All other components within normal limits    Imaging Review No results found.     MDM   1. Strep pharyngitis      Centor Criteria   no :Exudative Tonsillitis  no :Presence of Cough yes :Hx of Fever yes :Tender cervical lymph adenopathy no :Age less than 14 (+1) or over 44 (-1)  Total 3/4. RSS positive, given injection of decadron, PCN, and magic mouthwash. Return to clinic as needed.      Barnet Glasgow, NP 02/24/17 [id number]

## 2017-11-12 ENCOUNTER — Other Ambulatory Visit: Payer: Self-pay | Admitting: Internal Medicine

## 2017-11-12 DIAGNOSIS — Z1231 Encounter for screening mammogram for malignant neoplasm of breast: Secondary | ICD-10-CM

## 2017-11-30 ENCOUNTER — Other Ambulatory Visit (HOSPITAL_COMMUNITY): Payer: Self-pay | Admitting: Obstetrics and Gynecology

## 2017-11-30 DIAGNOSIS — N920 Excessive and frequent menstruation with regular cycle: Secondary | ICD-10-CM

## 2017-12-03 ENCOUNTER — Ambulatory Visit: Payer: 59

## 2017-12-21 ENCOUNTER — Ambulatory Visit (HOSPITAL_COMMUNITY)
Admission: RE | Admit: 2017-12-21 | Discharge: 2017-12-21 | Disposition: A | Discharge status: Home / Self Care | Payer: 59 | Source: Ambulatory Visit | Attending: Obstetrics and Gynecology | Admitting: Obstetrics and Gynecology

## 2017-12-21 DIAGNOSIS — N920 Excessive and frequent menstruation with regular cycle: Secondary | ICD-10-CM | POA: Diagnosis not present

## 2017-12-21 DIAGNOSIS — N852 Hypertrophy of uterus: Secondary | ICD-10-CM | POA: Insufficient documentation

## 2017-12-21 DIAGNOSIS — D252 Subserosal leiomyoma of uterus: Secondary | ICD-10-CM | POA: Diagnosis not present

## 2017-12-24 ENCOUNTER — Other Ambulatory Visit: Payer: Self-pay | Admitting: Obstetrics and Gynecology

## 2018-01-11 ENCOUNTER — Other Ambulatory Visit: Payer: Self-pay

## 2018-01-11 ENCOUNTER — Encounter (HOSPITAL_COMMUNITY): Payer: Self-pay | Admitting: Emergency Medicine

## 2018-01-11 ENCOUNTER — Ambulatory Visit (HOSPITAL_COMMUNITY)
Admission: EM | Admit: 2018-01-11 | Discharge: 2018-01-11 | Disposition: A | Discharge status: Home / Self Care | Payer: 59 | Attending: Family Medicine | Admitting: Family Medicine

## 2018-01-11 DIAGNOSIS — J020 Streptococcal pharyngitis: Secondary | ICD-10-CM | POA: Diagnosis not present

## 2018-01-11 LAB — POCT RAPID STREP A: STREPTOCOCCUS, GROUP A SCREEN (DIRECT): POSITIVE — AB

## 2018-01-11 MED ORDER — ACETAMINOPHEN 325 MG PO TABS
650.0000 mg | ORAL_TABLET | Freq: Once | ORAL | Status: AC
  Administered 2018-01-11: 650 mg via ORAL

## 2018-01-11 MED ORDER — CEFTRIAXONE SODIUM 1 G IJ SOLR
INTRAMUSCULAR | Status: AC
  Filled 2018-01-11: qty 10

## 2018-01-11 MED ORDER — CEFTRIAXONE SODIUM 1 G IJ SOLR
1.0000 g | Freq: Once | INTRAMUSCULAR | Status: AC
  Administered 2018-01-11: 1 g via INTRAMUSCULAR

## 2018-01-11 MED ORDER — AMOXICILLIN 500 MG PO CAPS: 500.0000 mg | ORAL_CAPSULE | Freq: Two times a day (BID) | ORAL | 0 refills | Status: AC

## 2018-01-11 MED ORDER — LIDOCAINE HCL (PF) 1 % IJ SOLN
INTRAMUSCULAR | Status: AC
  Filled 2018-01-11: qty 2

## 2018-01-11 MED ORDER — ACETAMINOPHEN 325 MG PO TABS
ORAL_TABLET | ORAL | Status: AC
  Filled 2018-01-11: qty 2

## 2018-01-11 NOTE — ED Triage Notes (Signed)
Sore throat, headache, chills that started yesterday

## 2018-01-11 NOTE — Discharge Instructions (Addendum)
1 gram ceftriaxone given in office Take antibiotic as prescribed and to completion Alternate ibuprofen and tylenol as needed for symptomatic relief Please follow up with your PCP next week Return or go to the ER if you have any new or worsening symptoms such as your fever does not moderate with treatment, you are unable to swallow your medication, you are drooling, you are unable to swallow liquids or own secretions, you have difficulty breathing, etc..Marland Kitchen

## 2018-01-11 NOTE — ED Provider Notes (Signed)
[place]   [id number] 01/11/18 Arrival Time: 6269  SUBJECTIVE: History from: patient.  Anita Jordan is a 42 y.o. female who presents with abrupt onset of sore throat that began 1 day ago .  Denies positive sick exposure or precipitating event.  Has tried theraflu without relief.  Symptoms are made worse with swallowing.  Reports previous symptoms in the past and diagnosed with strep.   Complains of fever, chills, fatigue,  SOB, and increased drooling at night.   Denies fever, chills, fatigue, sinus pain, rhinorrhea, sore throat, SOB, wheezing, chest pain, nausea, changes in bowel or bladder habits.    ROS: As per HPI.  Past Medical History:  Diagnosis Date  . Hypertension    Past Surgical History:  Procedure Laterality Date  . CESAREAN SECTION    . FEMUR FRACTURE SURGERY    . KIDNEY STONE SURGERY     No Known Allergies No current facility-administered medications on file prior to encounter.    Current Outpatient Medications on File Prior to Encounter  Medication Sig Dispense Refill  . lisinopril-hydrochlorothiazide (PRINZIDE,ZESTORETIC) 20-12.5 MG per tablet Take 1 tablet by mouth daily.    . magic mouthwash w/lidocaine SOLN Take 5 mLs by mouth 3 (three) times daily as needed for mouth pain. 100 mL 0   Social History   Socioeconomic History  . Marital status: Single    Spouse name: Not on file  . Number of children: Not on file  . Years of education: Not on file  . Highest education level: Not on file  Occupational History  . Not on file  Social Needs  . Financial resource strain: Not on file  . Food insecurity:    Worry: Not on file    Inability: Not on file  . Transportation needs:    Medical: Not on file    Non-medical: Not on file  Tobacco Use  . Smoking status: Current Every Day Smoker  . Smokeless tobacco: Never Used  Substance and Sexual Activity  . Alcohol use: Yes  . Drug use: No  . Sexual activity: Not on file  Lifestyle  . Physical  activity:    Days per week: Not on file    Minutes per session: Not on file  . Stress: Not on file  Relationships  . Social connections:    Talks on phone: Not on file    Gets together: Not on file    Attends religious service: Not on file    Active member of club or organization: Not on file    Attends meetings of clubs or organizations: Not on file    Relationship status: Not on file  . Intimate partner violence:    Fear of current or ex partner: Not on file    Emotionally abused: Not on file    Physically abused: Not on file    Forced sexual activity: Not on file  Other Topics Concern  . Not on file  Social History Narrative  . Not on file   Family History  Problem Relation Age of Onset  . Hypertension Mother   . Hypertension Father     OBJECTIVE:  Vitals:   01/11/18 1649 01/11/18 1719  BP: 137/83 128/85  Pulse: (!) 118 (!) 107  Resp: (!) 24 20  Temp: (!) 103.3 F (39.6 C) (!) 103.2 F (39.6 C)  TempSrc: Oral Oral  SpO2: 97% 96%     General appearance: alert; appears acutely ill; laying back on exam table; tolerating own  secretions; speak in full sentences HEENT: Ears: EACs clear, TMs pearly gray with visible cone of light, without erythema; Eyes: PERRL, EOMI grossly; Sinuses nontender to palpation; Nose: clear rhinorrhea; Throat: tonsils 4+ erythematous with white exudates; Neck: LAD Lungs: CTA bilaterally without adventitious breath sounds Heart: Tachycardic; Systolic murmur heard in aortic and pulmonic regions.  Radial pulses 2+ symmetrical bilaterally Skin: warm and mois Psychological: alert and cooperative; normal mood and affect  Imaging: No results found.  Results for orders placed or performed during the hospital encounter of 01/11/18 (from the past 24 hour(s))  POCT rapid strep A [facility])     Status: Abnormal   Collection Time: 01/11/18  5:02 PM  Result Value Ref Range   Streptococcus, Group A Screen (Direct) POSITIVE (A) NEGATIVE     ASSESSMENT & PLAN:  1. Streptococcal pharyngitis     Meds ordered this encounter  Medications  . acetaminophen (TYLENOL) tablet 650 mg  . cefTRIAXone (ROCEPHIN) injection 1 g  . amoxicillin (AMOXIL) 500 MG capsule    Sig: Take 1 capsule (500 mg total) by mouth 2 (two) times daily for 10 days.    Dispense:  20 capsule    Refill:  0    Order Specific Question:   Supervising Provider    Answer:   Wynona Luna [[id number]]    1 gram ceftriaxone given in office Take antibiotic as prescribed and to completion Alternate ibuprofen and tylenol as needed for symptomatic relief Please follow up with your PCP next week Return or go to the ER if you have any new or worsening symptoms such as your fever does not moderate with treatment, you are unable to swallow your medication, you are drooling, you are unable to swallow liquids or own secretions, you have difficulty breathing, etc...   Reviewed expectations re: course of current medical issues. Questions answered. Outlined signs and symptoms indicating need for more acute intervention. Patient verbalized understanding. After Visit Summary given.        Lestine Box, PA-C 01/11/18 1735

## 2019-07-03 ENCOUNTER — Telehealth: Payer: 59 | Admitting: Physician Assistant

## 2019-07-03 DIAGNOSIS — Z20828 Contact with and (suspected) exposure to other viral communicable diseases: Secondary | ICD-10-CM

## 2019-07-03 DIAGNOSIS — Z20822 Contact with and (suspected) exposure to covid-19: Secondary | ICD-10-CM

## 2019-07-03 DIAGNOSIS — J069 Acute upper respiratory infection, unspecified: Secondary | ICD-10-CM

## 2019-07-03 NOTE — Progress Notes (Signed)
E-Visit for Corona Virus Screening   Your current symptoms could be consistent with the coronavirus.  Many health care providers can now test patients at their office but not all are.  [facility] has multiple testing sites. For information on our COVID testing locations and hours go to [email or web address]  Please quarantine yourself while awaiting your test results.  We are enrolling you in our MyChart Home [place] for COVID19 . Daily you will receive a questionnaire within the MyChart website. Our COVID 19 response team willl be monitoriing your responses daily.    COVID-19 is a respiratory illness with symptoms that are similar to the flu. Symptoms are typically mild to moderate, but there have been cases of severe illness and death due to the virus. The following symptoms may appear 2-14 days after exposure: . Fever . Cough . Shortness of breath or difficulty breathing . Chills . Repeated shaking with chills . Muscle pain . Headache . Sore throat . New loss of taste or smell . Fatigue . Congestion or runny nose . Nausea or vomiting . Diarrhea  It is vitally important that if you feel that you have an infection such as this virus or any other virus that you stay home and away from places where you may spread it to others.  You should self-quarantine for 14 days if you have symptoms that could potentially be coronavirus or have been in close contact a with a person diagnosed with COVID-19 within the last 2 [place]. You should avoid contact with people age 65 and older.   You should wear a mask or cloth face covering over your nose and mouth if you must be around other people or animals, including pets (even at home). Try to stay at least 6 feet away from other people. This will protect the people around you.  You may also take acetaminophen (Tylenol) as needed for fever.   Reduce your risk of any infection by using the same precautions used for avoiding the  common cold or flu:  . Wash your hands often with soap and warm water for at least 20 seconds.  If soap and water are not readily available, use an alcohol-based hand sanitizer with at least 60% alcohol.  . If coughing or sneezing, cover your mouth and nose by coughing or sneezing into the elbow areas of your shirt or coat, into a tissue or into your sleeve (not your hands). . Avoid shaking hands with others and consider head nods or verbal greetings only. . Avoid touching your eyes, nose, or mouth with unwashed hands.  . Avoid close contact with people who are sick. . Avoid places or events with large numbers of people in one location, like concerts or sporting events. . Carefully consider travel plans you have or are making. . If you are planning any travel outside or inside the [place], visit the CDC's Travelers' Health webpage for the latest health notices. . If you have some symptoms but not all symptoms, continue to monitor at home and seek medical attention if your symptoms worsen. . If you are having a medical emergency, call 911.  HOME CARE . Only take medications as instructed by your medical team. . Drink plenty of fluids and get plenty of rest. . A steam or ultrasonic humidifier can help if you have congestion.   GET HELP RIGHT AWAY IF YOU HAVE EMERGENCY WARNING SIGNS** FOR COVID-19. If you or someone is showing any of these signs seek emergency medical care immediately. Call   911 or proceed to your closest emergency facility if: . You develop worsening high fever. . Trouble breathing . Bluish lips or face . Persistent pain or pressure in the chest . New confusion . Inability to wake or stay awake . You cough up blood. . Your symptoms become more severe  **This list is not all possible symptoms. Contact your medical provider for any symptoms that are sever or concerning to you.   MAKE SURE YOU   Understand these instructions.  Will watch your condition.  Will get help right  away if you are not doing well or get worse.  Your e-visit answers were reviewed by a board certified advanced clinical practitioner to complete your personal care plan.  Depending on the condition, your plan could have included both over the counter or prescription medications.  If there is a problem please reply once you have received a response from your provider.  Your safety is important to us.  If you have drug allergies check your prescription carefully.    You can use MyChart to ask questions about today's visit, request a non-urgent call back, or ask for a work or school excuse for 24 hours related to this e-Visit. If it has been greater than 24 hours you will need to follow up with your provider, or enter a new e-Visit to address those concerns. You will get an e-mail in the next two days asking about your experience.  I hope that your e-visit has been valuable and will speed your recovery. Thank you for using e-visits.    

## 2019-07-03 NOTE — Progress Notes (Signed)
I have spent 5 minutes in review of e-visit questionnaire, review and updating patient chart, medical decision making and response to patient.   Kimba Lottes Cody Quindon Denker, PA-C    

## 2019-07-05 ENCOUNTER — Other Ambulatory Visit: Payer: Self-pay

## 2019-07-05 DIAGNOSIS — Z20822 Contact with and (suspected) exposure to covid-19: Secondary | ICD-10-CM

## 2019-07-07 LAB — NOVEL CORONAVIRUS, NAA: SARS-CoV-2, NAA: NOT DETECTED

## 2019-08-23 ENCOUNTER — Other Ambulatory Visit: Payer: Self-pay | Admitting: Internal Medicine

## 2019-08-23 DIAGNOSIS — Z1231 Encounter for screening mammogram for malignant neoplasm of breast: Secondary | ICD-10-CM

## 2019-08-25 ENCOUNTER — Encounter (HOSPITAL_BASED_OUTPATIENT_CLINIC_OR_DEPARTMENT_OTHER): Payer: Self-pay

## 2019-08-25 DIAGNOSIS — G4709 Other insomnia: Secondary | ICD-10-CM

## 2019-09-06 ENCOUNTER — Other Ambulatory Visit (HOSPITAL_COMMUNITY)
Admission: RE | Admit: 2019-09-06 | Discharge: 2019-09-06 | Disposition: A | Discharge status: Home / Self Care | Payer: Self-pay | Source: Ambulatory Visit | Attending: Internal Medicine | Admitting: Internal Medicine

## 2019-09-06 DIAGNOSIS — Z20822 Contact with and (suspected) exposure to covid-19: Secondary | ICD-10-CM | POA: Insufficient documentation

## 2019-09-06 DIAGNOSIS — Z01812 Encounter for preprocedural laboratory examination: Secondary | ICD-10-CM | POA: Insufficient documentation

## 2019-09-07 LAB — NOVEL CORONAVIRUS, NAA (HOSP ORDER, SEND-OUT TO REF LAB; TAT 18-24 HRS): SARS-CoV-2, NAA: NOT DETECTED

## 2019-09-09 ENCOUNTER — Other Ambulatory Visit: Payer: Self-pay

## 2019-09-09 ENCOUNTER — Ambulatory Visit (HOSPITAL_BASED_OUTPATIENT_CLINIC_OR_DEPARTMENT_OTHER): Payer: Self-pay | Attending: Internal Medicine | Admitting: Internal Medicine

## 2019-09-09 VITALS — Ht 64.0 in | Wt 290.0 lb

## 2019-09-09 DIAGNOSIS — G4709 Other insomnia: Secondary | ICD-10-CM | POA: Insufficient documentation

## 2019-09-09 DIAGNOSIS — G4733 Obstructive sleep apnea (adult) (pediatric): Secondary | ICD-10-CM

## 2019-09-12 ENCOUNTER — Other Ambulatory Visit: Payer: Self-pay

## 2019-09-17 DIAGNOSIS — G4709 Other insomnia: Secondary | ICD-10-CM

## 2019-09-17 NOTE — Procedures (Signed)
Patient Name: Anita Jordan, Anita Jordan Date: 09/09/2019 Gender: Female D.O.B: [birth year] Age (years): 43 Referring Provider: Nolene Ebbs Height (inches): 64 Interpreting Physician: Baird Lyons MD, ABSM Weight (lbs): 290 RPSGT: Earney Hamburg BMI: 50 MRN: [id number] Neck Size: 17.00  CLINICAL INFORMATION Sleep Study Type: Split Night CPAP Indication for sleep study: OSA, Snoring Epworth Sleepiness Score: 13  SLEEP STUDY TECHNIQUE As per the AASM Manual for the Scoring of Sleep and Associated Events v2.3 (April 2016) with a hypopnea requiring 4% desaturations.  The channels recorded and monitored were frontal, central and occipital EEG, electrooculogram (EOG), submentalis EMG (chin), nasal and oral airflow, thoracic and abdominal wall motion, anterior tibialis EMG, snore microphone, electrocardiogram, and pulse oximetry. Continuous positive airway pressure (CPAP) was initiated when the patient met split night criteria and was titrated according to treat sleep-disordered breathing.  MEDICATIONS Medications self-administered by patient taken the night of the study : none reported  RESPIRATORY PARAMETERS Diagnostic  Total AHI (/hr): 46.0 RDI (/hr): 52.8 OA Index (/hr): - CA Index (/hr): 0.0 REM AHI (/hr): 68.6 NREM AHI (/hr): 42.2 Supine AHI (/hr): 72.3 Non-supine AHI (/hr): 32.5 Min O2 Sat (%): 86.0 Mean O2 (%): 93.8 Time below 88% (min): 1.5   Titration  Optimal Pressure (cm): 11 AHI at Optimal Pressure (/hr): 0.0 Min O2 at Optimal Pressure (%): 92.0 Supine % at Optimal (%): 0 Sleep % at Optimal (%): 99   SLEEP ARCHITECTURE The recording time for the entire night was 362.7 minutes.  During a baseline period of 166.0 minutes, the patient slept for 122.6 minutes in REM and nonREM, yielding a sleep efficiency of 73.9%%. Sleep onset after lights out was 34.9 minutes with a REM latency of 55.0 minutes. The patient spent 2.4%% of the night in stage N1 sleep, 83.3%% in stage N2  sleep, 0.0%% in stage N3 and 14.3% in REM.  During the titration period of 187.1 minutes, the patient slept for 173.8 minutes in REM and nonREM, yielding a sleep efficiency of 92.9%%. Sleep onset after CPAP initiation was 6.3 minutes with a REM latency of 87.5 minutes. The patient spent 0.6%% of the night in stage N1 sleep, 81.3%% in stage N2 sleep, 0.0%% in stage N3 and 18.1% in REM.  CARDIAC DATA The 2 lead EKG demonstrated sinus rhythm. The mean heart rate was 100.0 beats per minute. Other EKG findings include: None. LEG MOVEMENT DATA The total Periodic Limb Movements of Sleep (PLMS) were 0. The PLMS index was 0.0 .  IMPRESSIONS - Severe obstructive sleep apnea occurred during the diagnostic portion of the study (AHI = 46.0/hour). An optimal PAP pressure was selected for this patient ( 11 cm of water) - No significant central sleep apnea occurred during the diagnostic portion of the study (CAI = 0.0/hour). - Moderate oxygen desaturation was noted during the diagnostic portion of the study (Min O2 =86.0%). Minimum sat on CPAP 11 was 92%. - The patient snored with loud snoring volume during the diagnostic portion of the study. - No cardiac abnormalities were noted during this study. - Clinically significant periodic limb movements did not occur during sleep.  DIAGNOSIS - Obstructive Sleep Apnea (327.23 [G47.33 ICD-10])  RECOMMENDATIONS - Trial of CPAP therapy on 11 cm H2O or autopap 5-15. - Patient used a Small size Fisher&Paykel Full Face Mask Simplus mask and heated humidification. - Be careful with alcohol, sedatives and other CNS depressants that may worsen sleep apnea and disrupt normal sleep architecture. - Sleep hygiene should be reviewed to assess factors  that may improve sleep quality. - Weight management and regular exercise should be initiated or continued.  [Electronically signed] 09/17/2019 01:22 PM  Baird Lyons MD, [place], American Board of Sleep  Medicine   NPI: [id number]                         [place], [place] [facility]  ELECTRONICALLY SIGNED ON:  09/17/2019, 1:20 PM [place] PH: [phone]   FX: [phone] [place]

## 2019-11-10 ENCOUNTER — Ambulatory Visit (INDEPENDENT_AMBULATORY_CARE_PROVIDER_SITE_OTHER)
Admission: RE | Admit: 2019-11-10 | Discharge: 2019-11-10 | Disposition: A | Discharge status: Home / Self Care | Payer: Self-pay | Source: Ambulatory Visit

## 2019-11-10 DIAGNOSIS — R059 Cough, unspecified: Secondary | ICD-10-CM

## 2019-11-10 DIAGNOSIS — R05 Cough: Secondary | ICD-10-CM

## 2019-11-10 DIAGNOSIS — R0602 Shortness of breath: Secondary | ICD-10-CM

## 2019-11-10 MED ORDER — ALBUTEROL SULFATE HFA 108 (90 BASE) MCG/ACT IN AERS: 2.0000 | INHALATION_SPRAY | RESPIRATORY_TRACT | 0 refills | Status: AC | PRN

## 2019-11-10 MED ORDER — PREDNISONE 10 MG PO TABS: 40.0000 mg | ORAL_TABLET | Freq: Every day | ORAL | 0 refills | Status: AC

## 2019-11-10 NOTE — Discharge Instructions (Addendum)
Use the albuterol inhaler and take prednisone as directed.    Follow up with your primary care provider or come here to be seen in person if your symptoms are not improving.

## 2019-11-10 NOTE — ED Provider Notes (Signed)
Virtual Visit via Video Note:  Anita Jordan  initiated request for Telemedicine visit with [facility] Urgent Care team. I connected with Anita Jordan  on 11/10/2019 at 4:51 PM  for a synchronized telemedicine visit using a video enabled HIPPA compliant telemedicine application. I verified that I am speaking with Anita Jordan  using two identifiers. Sharion Balloon, NP  was physically located in a [facility] Urgent care site and Anita Jordan was located at a different location.   The limitations of evaluation and management by telemedicine as well as the availability of in-person appointments were discussed. Patient was informed that she  may incur a bill ( including co-pay) for this virtual visit encounter. Anita Jordan  expressed understanding and gave verbal consent to proceed with virtual visit.     History of Present Illness:Anita Jordan  is a 44 y.o. female presents for evaluation of 1 month history of nonproductive cough, shortness of breath, and wheezing.  The cough is worse at night.  She denies fever, chills, sore throat, vomiting, diarrhea, rash, or other symptoms.  No history of asthma or GERD.  Smokes 0.5 ppd x 15 years.  Last tested for COVID on 09/06/2019.  She denies pregnancy or breastfeeding.    No Known Allergies   Past Medical History:  Diagnosis Date  . Hypertension      Social History   Tobacco Use  . Smoking status: Current Every Day Smoker  . Smokeless tobacco: Never Used  Substance Use Topics  . Alcohol use: Yes  . Drug use: No   ROS: as stated in HPI.  All other systems reviewed and negative.      Observations/Objective: Physical Exam  VITALS: Patient denies fever. GENERAL: Alert, appears well and in no acute distress. HEENT: Atraumatic. NECK: Normal movements of the head and neck. CARDIOPULMONARY: No increased WOB. Speaking in clear sentences. I:E ratio WNL.  MS: Moves all visible extremities without noticeable abnormality. PSYCH: Pleasant and  cooperative, well-groomed. Speech normal rate and rhythm. Affect is appropriate. Insight and judgement are appropriate. Attention is focused, linear, and appropriate.  NEURO: CN grossly intact. Oriented as arrived to appointment on time with no prompting. Moves both UE equally.  SKIN: No obvious lesions, wounds, erythema, or cyanosis noted on face or hands.   Assessment and Plan:    ICD-10-CM   1. Cough  R05   2. Shortness of breath  R06.02        Follow Up Instructions: Treating with albuterol inhaler and prednisone.  Discussed with patient that she should consider a COVID test.  Instructed her to follow-up with her PCP or come here to be seen in person if her symptoms are not improving.  Patient agrees to plan of care.    I discussed the assessment and treatment plan with the patient. The patient was provided an opportunity to ask questions and all were answered. The patient agreed with the plan and demonstrated an understanding of the instructions.   The patient was advised to call back or seek an in-person evaluation if the symptoms worsen or if the condition fails to improve as anticipated.      Sharion Balloon, NP  11/10/2019 4:51 PM         Sharion Balloon, NP 11/10/19 1651

## 2021-06-18 ENCOUNTER — Ambulatory Visit (HOSPITAL_COMMUNITY): Admit: 2021-06-18 | Discharge status: Against Medical Advice | Payer: 59

## 2021-06-19 ENCOUNTER — Ambulatory Visit (HOSPITAL_COMMUNITY)
Admission: EM | Admit: 2021-06-19 | Discharge: 2021-06-19 | Disposition: A | Discharge status: Home / Self Care | Payer: 59 | Attending: Emergency Medicine | Admitting: Emergency Medicine

## 2021-06-19 ENCOUNTER — Encounter (HOSPITAL_COMMUNITY): Payer: Self-pay | Admitting: Emergency Medicine

## 2021-06-19 ENCOUNTER — Other Ambulatory Visit: Payer: Self-pay

## 2021-06-19 DIAGNOSIS — B349 Viral infection, unspecified: Secondary | ICD-10-CM | POA: Diagnosis present

## 2021-06-19 LAB — POC INFLUENZA A AND B ANTIGEN (URGENT CARE ONLY)
INFLUENZA A ANTIGEN, POC: NEGATIVE
INFLUENZA B ANTIGEN, POC: NEGATIVE

## 2021-06-19 LAB — POCT RAPID STREP A, ED / UC: Streptococcus, Group A Screen (Direct): NEGATIVE

## 2021-06-19 NOTE — ED Triage Notes (Signed)
Pt c/o cough, fevers, congestion, body aches for a couple days.

## 2021-06-19 NOTE — ED Provider Notes (Signed)
[facility]    CSN: [id number] Arrival date & time: 06/19/21  0177      History   Chief Complaint Chief Complaint  Patient presents with   Cough   Fever    HPI Anita Jordan is a 45 y.o. female.   Patient here for evaluation of cough, fevers congestion, and body aches that have been ongoing for the past 4 days.  Reports taking TheraFlu and other OTC medication with minimal symptom relief.  Reports developing fever yesterday.  Reports symptoms have gotten progressively worse.  Denies any recent sick contacts.  Patient has not been vaccinated against the flu.  Patient did do an at home COVID test that was negative.  Denies any trauma, injury, or other precipitating event.  Denies any specific alleviating or aggravating factors.  Denies any chest pain, shortness of breath, N/V/D, numbness, tingling, weakness, abdominal pain, or headaches.    The history is provided by the patient.  Cough Associated symptoms: fever and myalgias   Fever Associated symptoms: congestion, cough and myalgias    Past Medical History:  Diagnosis Date   Hypertension     There are no problems to display for this patient.   Past Surgical History:  Procedure Laterality Date   CESAREAN SECTION     FEMUR FRACTURE SURGERY     KIDNEY STONE SURGERY      OB History   No obstetric history on file.      Home Medications    Prior to Admission medications   Medication Sig Start Date End Date Taking? Authorizing Provider  albuterol (VENTOLIN HFA) 108 (90 Base) MCG/ACT inhaler Inhale 2 puffs into the lungs every 4 (four) hours as needed for wheezing or shortness of breath. 11/10/19   Sharion Balloon, NP  lisinopril-hydrochlorothiazide (PRINZIDE,ZESTORETIC) 20-12.5 MG per tablet Take 1 tablet by mouth daily.    [provider]  magic mouthwash w/lidocaine SOLN Take 5 mLs by mouth 3 (three) times daily as needed for mouth pain. 02/24/17   Barnet Glasgow, NP    Family History Family  History  Problem Relation Age of Onset   Hypertension Mother    Hypertension Father     Social History Social History   Tobacco Use   Smoking status: Every Day   Smokeless tobacco: Never  Substance Use Topics   Alcohol use: Yes   Drug use: No     Allergies   Patient has no known allergies.   Review of Systems Review of Systems  Constitutional:  Positive for fatigue and fever.  HENT:  Positive for congestion.   Respiratory:  Positive for cough.   Musculoskeletal:  Positive for myalgias.  All other systems reviewed and are negative.   Physical Exam Triage Vital Signs ED Triage Vitals  Enc Vitals Group     BP 06/19/21 0842 (!) 132/109     Pulse Rate 06/19/21 0842 93     Resp 06/19/21 0842 18     Temp 06/19/21 0842 98.9 F (37.2 C)     Temp Source 06/19/21 0842 Oral     SpO2 06/19/21 0842 99 %     Weight --      Height --      Head Circumference --      Peak Flow --      Pain Score 06/19/21 0840 4     Pain Loc --      Pain Edu? --      Excl. in [place]? --  No data found.  Updated Vital Signs BP (!) 132/109 (BP Location: Left Arm)   Pulse 93   Temp 98.9 F (37.2 C) (Oral)   Resp 18   LMP 06/05/2021   SpO2 99%   Visual Acuity Right Eye Distance:   Left Eye Distance:   Bilateral Distance:    Right Eye Near:   Left Eye Near:    Bilateral Near:     Physical Exam Vitals and nursing note reviewed.  Constitutional:      General: She is not in acute distress.    Appearance: Normal appearance. She is not ill-appearing, toxic-appearing or diaphoretic.  HENT:     Head: Normocephalic and atraumatic.     Nose: Congestion present.     Mouth/Throat:     Pharynx: Posterior oropharyngeal erythema present. No oropharyngeal exudate.  Eyes:     Extraocular Movements: Extraocular movements intact.     Conjunctiva/sclera: Conjunctivae normal.     Pupils: Pupils are equal, round, and reactive to light.  Cardiovascular:     Rate and Rhythm: Normal rate and  regular rhythm.     Pulses: Normal pulses.     Heart sounds: Normal heart sounds.  Pulmonary:     Effort: Pulmonary effort is normal.     Breath sounds: Normal breath sounds.  Abdominal:     General: Abdomen is flat.  Musculoskeletal:        General: Normal range of motion.     Cervical back: Normal range of motion.  Skin:    General: Skin is warm and dry.  Neurological:     General: No focal deficit present.     Mental Status: She is alert and oriented to person, place, and time.  Psychiatric:        Mood and Affect: Mood normal.     UC Treatments / Results  Labs (all labs ordered are listed, but only abnormal results are displayed) Labs Reviewed  CULTURE, GROUP A STREP ([place])  POC INFLUENZA A AND B ANTIGEN (URGENT CARE ONLY)  POCT RAPID STREP A, ED / UC    EKG   Radiology No results found.  Procedures Procedures (including critical care time)  Medications Ordered in UC Medications - No data to display  Initial Impression / Assessment and Plan / UC Course  I have reviewed the triage vital signs and the nursing notes.  Pertinent labs & imaging results that were available during my care of the patient were reviewed by me and considered in my medical decision making (see chart for details).    Assessment negative for red flags or concerns.  Flu swab negative.  Rapid strep negative, throat culture pending.  This is likely a viral illness.  Declines COVID test.  Tylenol and or ibuprofen as needed.  Encourage fluids and rest.  Discussed conservative symptom management as described in discharge instructions.  Declined work note.  Follow-up with primary care for reevaluation as needed. Final Clinical Impressions(s) / UC Diagnoses   Final diagnoses:  Viral illness     Discharge Instructions      You can take Tylenol and/or Ibuprofen as needed for fever reduction and pain relief.   For cough: honey 1/2 to 1 teaspoon (you can dilute the honey in water or another  fluid).  You can also use guaifenesin and dextromethorphan for cough. You can use a humidifier for chest congestion and cough.  If you don't have a humidifier, you can sit in the bathroom with the hot shower running.  For sore throat: try warm salt water gargles, cepacol lozenges, throat spray, warm tea or water with lemon/honey, popsicles or ice, or OTC cold relief medicine for throat discomfort.    For congestion: take a daily anti-histamine like Zyrtec, Claritin, and a oral decongestant, such as pseudoephedrine.  You can also use Flonase 1-2 sprays in each nostril daily.    It is important to stay hydrated: drink plenty of fluids (water, gatorade/powerade/pedialyte, juices, or teas) to keep your throat moisturized and help further relieve irritation/discomfort.   Return or go to the Emergency Department if symptoms worsen or do not improve in the next few days.      ED Prescriptions   None    PDMP not reviewed this encounter.   Pearson Forster, NP 06/19/21 1006

## 2021-06-19 NOTE — Discharge Instructions (Signed)
You can take Tylenol and/or Ibuprofen as needed for fever reduction and pain relief.   For cough: honey 1/2 to 1 teaspoon (you can dilute the honey in water or another fluid).  You can also use guaifenesin and dextromethorphan for cough. You can use a humidifier for chest congestion and cough.  If you don't have a humidifier, you can sit in the bathroom with the hot shower running.     For sore throat: try warm salt water gargles, cepacol lozenges, throat spray, warm tea or water with lemon/honey, popsicles or ice, or OTC cold relief medicine for throat discomfort.    For congestion: take a daily anti-histamine like Zyrtec, Claritin, and a oral decongestant, such as pseudoephedrine.  You can also use Flonase 1-2 sprays in each nostril daily.    It is important to stay hydrated: drink plenty of fluids (water, gatorade/powerade/pedialyte, juices, or teas) to keep your throat moisturized and help further relieve irritation/discomfort.   Return or go to the Emergency Department if symptoms worsen or do not improve in the next few days.

## 2021-06-21 LAB — CULTURE, GROUP A STREP (THRC)

## 2022-06-02 ENCOUNTER — Encounter (INDEPENDENT_AMBULATORY_CARE_PROVIDER_SITE_OTHER): Payer: Self-pay

## 2022-09-22 ENCOUNTER — Encounter (HOSPITAL_COMMUNITY): Payer: Self-pay

## 2022-09-22 ENCOUNTER — Other Ambulatory Visit: Payer: Self-pay

## 2022-09-22 ENCOUNTER — Ambulatory Visit (INDEPENDENT_AMBULATORY_CARE_PROVIDER_SITE_OTHER): Payer: 59

## 2022-09-22 ENCOUNTER — Ambulatory Visit (HOSPITAL_COMMUNITY)
Admission: RE | Admit: 2022-09-22 | Discharge: 2022-09-22 | Disposition: A | Discharge status: Home / Self Care | Payer: 59 | Source: Ambulatory Visit

## 2022-09-22 VITALS — BP 145/93 | HR 77 | Temp 98.2°F | Resp 16

## 2022-09-22 DIAGNOSIS — M5030 Other cervical disc degeneration, unspecified cervical region: Secondary | ICD-10-CM

## 2022-09-22 MED ORDER — PREDNISONE 10 MG PO TABS: 10.0000 mg | ORAL_TABLET | Freq: Every day | ORAL | 0 refills | Status: AC

## 2022-09-22 NOTE — ED Triage Notes (Signed)
Right arm pain onset 2 [place] ago.  Pain is worsening.  Patient wakes patient at night.  Pain involves entire arm.  Denies neck pain .  Patient is right handed.  Describes pain as tingling.  No known injury.  Patient types often with job.  Patient has not had any medications

## 2022-09-22 NOTE — ED Provider Notes (Signed)
[facility]    CSN: [id number] Arrival date & time: 09/22/22  1059      History   Chief Complaint Chief Complaint  Patient presents with   Arm Problem    HPI Anita Jordan is a 47 y.o. female.   Patient presents to clinic with complaints of right arm numbness and tingling that has been ongoing for the past 3 months.  Denies numbness or tingling in fingers, chest pain, shortness of breath, wrist pain, neck pain, and other areas of numbness or tingling.  Reports right-sided bicep and forearm numbness and tingling is worse in the mornings and then gradually resolves throughout the day.  Has taken blood pressure medication this morning reports her blood pressure is at baseline.  The history is provided by the patient.    Past Medical History:  Diagnosis Date   Hypertension     There are no problems to display for this patient.   Past Surgical History:  Procedure Laterality Date   CESAREAN SECTION     FEMUR FRACTURE SURGERY     KIDNEY STONE SURGERY      OB History   No obstetric history on file.      Home Medications    Prior to Admission medications   Medication Sig Start Date End Date Taking? Authorizing Provider  amLODipine (NORVASC) 10 MG tablet  09/01/17  Yes [provider]  predniSONE (DELTASONE) 10 MG tablet Take 1 tablet (10 mg total) by mouth daily with breakfast for 10 days. 09/22/22 10/02/22 Yes Louretta Shorten, [place] N, FNP  albuterol (VENTOLIN HFA) 108 (90 Base) MCG/ACT inhaler Inhale 2 puffs into the lungs every 4 (four) hours as needed for wheezing or shortness of breath. 11/10/19   Sharion Balloon, NP  hydrochlorothiazide (HYDRODIURIL) 12.5 MG tablet hydrochlorothiazide 12.5 mg tablet    [provider]  magic mouthwash w/lidocaine SOLN Take 5 mLs by mouth 3 (three) times daily as needed for mouth pain. 02/24/17   Barnet Glasgow, NP    Family History Family History  Problem Relation Age of Onset   Hypertension Mother     Hypertension Father     Social History Social History   Tobacco Use   Smoking status: Former    Types: Cigarettes   Smokeless tobacco: Never  Vaping Use   Vaping Use: Every day  Substance Use Topics   Alcohol use: Yes   Drug use: No     Allergies   Patient has no known allergies.   Review of Systems Review of Systems  Constitutional:  Negative for activity change and fever.  HENT:  Negative for sore throat.   Respiratory:  Negative for cough, chest tightness and shortness of breath.   Cardiovascular:  Negative for chest pain.  Genitourinary:  Negative for difficulty urinating.  Musculoskeletal:  Negative for back pain, joint swelling, neck pain and neck stiffness.  Skin:  Negative for color change.  Neurological:  Positive for numbness. Negative for weakness.       Reports anterior bicep and forearm tingling. Denies numbness / tingling to digits.      Physical Exam Triage Vital Signs ED Triage Vitals [09/22/22 1127]  Enc Vitals Group     BP (!) 145/93     Pulse Rate 77     Resp 16     Temp 98.2 F (36.8 C)     Temp Source Oral     SpO2 95 %     Weight  Height      Head Circumference      Peak Flow      Pain Score 1     Pain Loc      Pain Edu?      Excl. in [address]?    No data found.  Updated Vital Signs BP (!) 145/93 (BP Location: Left Arm)   Pulse 77   Temp 98.2 F (36.8 C) (Oral)   Resp 16   LMP  (LMP Unknown)   SpO2 95%   Visual Acuity Right Eye Distance:   Left Eye Distance:   Bilateral Distance:    Right Eye Near:   Left Eye Near:    Bilateral Near:     Physical Exam Vitals and nursing note reviewed.  Constitutional:      Appearance: Normal appearance. She is obese.     Comments: Pleasant 47 year old female who appears stated age.  HENT:     Head: Normocephalic and atraumatic.  Neck:     Comments: C-spine without pain to palpation, no step-off or deformity noted Cardiovascular:     Rate and Rhythm: Normal rate and regular  rhythm.     Pulses: Normal pulses.     Heart sounds: Normal heart sounds, S1 normal and S2 normal.  Pulmonary:     Effort: Pulmonary effort is normal. No respiratory distress.     Breath sounds: Normal breath sounds. No wheezing.     Comments: Lungs vesicular posteriorly. Musculoskeletal:        General: No tenderness. Normal range of motion.     Right shoulder: Normal range of motion. Normal strength.     Right upper arm: No swelling, edema, deformity or tenderness.     Right forearm: No swelling, edema, deformity or tenderness.     Cervical back: Full passive range of motion without pain and normal range of motion. No erythema, signs of trauma, rigidity, tenderness or crepitus. No pain with movement, spinous process tenderness or muscular tenderness.     Comments: Negative Tinnel sign.  Skin:    General: Skin is warm and dry.     Capillary Refill: Capillary refill takes less than 2 seconds.  Neurological:     General: No focal deficit present.     Mental Status: She is alert and oriented to person, place, and time.     Cranial Nerves: Cranial nerves 2-12 are intact.     Sensory: Sensation is intact.     Motor: Motor function is intact.     Gait: Gait is intact.  Psychiatric:        Mood and Affect: Mood normal.        Behavior: Behavior is cooperative.      UC Treatments / Results  Labs (all labs ordered are listed, but only abnormal results are displayed) Labs Reviewed - No data to display  EKG   Radiology DG Cervical Spine Complete  Result Date: 09/22/2022 CLINICAL DATA:  Right arm numbness EXAM: CERVICAL SPINE - COMPLETE 4 VIEW COMPARISON:  None Available. FINDINGS: No evidence of cervical spine fracture or prevertebral soft tissue swelling. Moderate degenerative disc disease at C5-C6 and C6-C7. Mild multilevel facet arthropathy. Alignment is normal. No other significant bone abnormalities are identified. IMPRESSION: Moderate degenerative disc disease of the lower  cervical spine and mild multilevel facet arthropathy. Electronically Signed   By: Yetta Glassman M.D.   On: 09/22/2022 12:26    Procedures Procedures (including critical care time)  Medications Ordered in UC Medications - No data  to display  Initial Impression / Assessment and Plan / UC Course  I have reviewed the triage vital signs and the nursing notes.  Pertinent labs & imaging results that were available during my care of the patient were reviewed by me and considered in my medical decision making (see chart for details).  Patient presents with right lower bicep and forearm pain, worse in the mornings for the last 3 months.  Neurologically intact.  No pain or weakness.  Upper extremity strength 5/5. Negative Tinnel sign. No digit numbness. Cervical radiographs obtained showed moderate degenerative disc disease of the lower cervical spine and mild multilevel facet arthropathy.  Started on 10-day course of steroids.  Advised to use NSAIDs as needed after this timeframe.  Follow-up with Ortho in 6 to 8 [place] if no improvement.  Plan of care discussed with patient, patient verbalized understanding.     Final Clinical Impressions(s) / UC Diagnoses   Final diagnoses:  Degenerative disc disease, cervical     Discharge Instructions      You were seen today for right arm numbness.  Your cervical x-rays showed some moderate degenerative disc disease and mild arthritis.  You have been started on prednisone please take this once daily for the next 10 days with breakfast or meal.  This can increase your blood sugar and your appetite, in turn increasing your weight, please be mindful of these side effects.  If numbness continues beyond 10 days you can use NSAIDs like ibuprofen, or Aleve.  If symptoms persist beyond 6 to 8 [place] please follow-up with [facility].  Please seek immediate care if you develop chest pain, shortness of breath, or bowel or bladder incontinence.      ED Prescriptions      Medication Sig Dispense Auth. Provider   predniSONE (DELTASONE) 10 MG tablet Take 1 tablet (10 mg total) by mouth daily with breakfast for 10 days. 10 tablet Perrin Gens, [place], [place]      I have reviewed the PDMP during this encounter.   Journee Kohen, [place], [place] 09/22/22 [phone]

## 2022-09-22 NOTE — Discharge Instructions (Addendum)
You were seen today for right arm numbness.  Your cervical x-rays showed some moderate degenerative disc disease and mild arthritis.  You have been started on prednisone please take this once daily for the next 10 days with breakfast or meal.  This can increase your blood sugar and your appetite, in turn increasing your weight, please be mindful of these side effects.  If numbness continues beyond 10 days you can use NSAIDs like ibuprofen, or Aleve.  If symptoms persist beyond 6 to 8 [place] please follow-up with [facility].  Please seek immediate care if you develop chest pain, shortness of breath, or bowel or bladder incontinence.

## 2023-01-21 ENCOUNTER — Encounter (INDEPENDENT_AMBULATORY_CARE_PROVIDER_SITE_OTHER): Payer: Self-pay

## 2023-03-10 ENCOUNTER — Other Ambulatory Visit: Payer: Self-pay | Admitting: Internal Medicine

## 2023-03-11 LAB — LIPID PANEL
Cholesterol: 191 mg/dL (ref <200)
HDL: 62 mg/dL (ref >=50)
LDL Cholesterol (Calc): 114 mg/dL (calc) — ABNORMAL HIGH
Non-HDL Cholesterol (Calc): 129 mg/dL (calc) (ref <130)
Total CHOL/HDL Ratio: 3.1 (calc) (ref <5.0)
Triglycerides: 66 mg/dL (ref <150)

## 2023-03-11 LAB — EXTRA LAV TOP TUBE

## 2023-10-29 ENCOUNTER — Other Ambulatory Visit: Payer: Self-pay | Admitting: Internal Medicine

## 2023-10-29 DIAGNOSIS — Z1231 Encounter for screening mammogram for malignant neoplasm of breast: Secondary | ICD-10-CM
